# Patient Record
Sex: Male | Born: 1970 | Hispanic: No | Marital: Married | Smoking: Never smoker
Health system: Southern US, Community
[De-identification: ages and names within clinical notes are randomized; demographics above are authoritative.]

## PROBLEM LIST (undated history)

## (undated) DIAGNOSIS — E785 Hyperlipidemia, unspecified: Secondary | ICD-10-CM

## (undated) DIAGNOSIS — I10 Essential (primary) hypertension: Secondary | ICD-10-CM

## (undated) DIAGNOSIS — I252 Old myocardial infarction: Secondary | ICD-10-CM

## (undated) DIAGNOSIS — I2510 Atherosclerotic heart disease of native coronary artery without angina pectoris: Secondary | ICD-10-CM

## (undated) DIAGNOSIS — I5032 Chronic diastolic (congestive) heart failure: Secondary | ICD-10-CM

## (undated) DIAGNOSIS — N200 Calculus of kidney: Secondary | ICD-10-CM

## (undated) DIAGNOSIS — K219 Gastro-esophageal reflux disease without esophagitis: Secondary | ICD-10-CM

## (undated) DIAGNOSIS — I255 Ischemic cardiomyopathy: Secondary | ICD-10-CM

## (undated) DIAGNOSIS — D72819 Decreased white blood cell count, unspecified: Secondary | ICD-10-CM

## (undated) HISTORY — DX: Old myocardial infarction: I25.2

## (undated) HISTORY — DX: Atherosclerotic heart disease of native coronary artery without angina pectoris: I25.10

## (undated) HISTORY — DX: Hyperlipidemia, unspecified: E78.5

## (undated) HISTORY — DX: Essential (primary) hypertension: I10

## (undated) HISTORY — DX: Decreased white blood cell count, unspecified: D72.819

## (undated) HISTORY — DX: Gastro-esophageal reflux disease without esophagitis: K21.9

## (undated) HISTORY — DX: Chronic diastolic (congestive) heart failure: I50.32

---
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
## 2003-02-25 ENCOUNTER — Emergency Department (HOSPITAL_COMMUNITY): Admission: EM | Admit: 2003-02-25 | Discharge: 2003-02-25 | Payer: Self-pay | Admitting: Emergency Medicine

## 2007-06-15 ENCOUNTER — Ambulatory Visit: Payer: Self-pay | Admitting: Internal Medicine

## 2007-06-15 ENCOUNTER — Encounter (INDEPENDENT_AMBULATORY_CARE_PROVIDER_SITE_OTHER): Payer: Self-pay | Admitting: Nurse Practitioner

## 2007-06-15 LAB — CONVERTED CEMR LAB
ALT: 15 units/L (ref 0–53)
AST: 16 units/L (ref 0–37)
Albumin: 4.6 g/dL (ref 3.5–5.2)
Alkaline Phosphatase: 73 units/L (ref 39–117)
BUN: 9 mg/dL (ref 6–23)
Basophils Absolute: 0 10*3/uL (ref 0.0–0.1)
Basophils Relative: 1 % (ref 0–1)
CO2: 26 meq/L (ref 19–32)
Calcium: 9.6 mg/dL (ref 8.4–10.5)
Chloride: 105 meq/L (ref 96–112)
Creatinine, Ser: 0.86 mg/dL (ref 0.40–1.50)
Eosinophils Absolute: 0.1 10*3/uL (ref 0.0–0.7)
Eosinophils Relative: 2 % (ref 0–5)
Glucose, Bld: 98 mg/dL (ref 70–99)
HCT: 48.5 % (ref 39.0–52.0)
Helicobacter Pylori Antibody-IgG: >8 — ABNORMAL HIGH
Hemoglobin: 16.4 g/dL (ref 13.0–17.0)
Lymphocytes Relative: 40 % (ref 12–46)
Lymphs Abs: 1.2 10*3/uL (ref 0.7–4.0)
MCHC: 33.8 g/dL (ref 30.0–36.0)
MCV: 87.2 fL (ref 78.0–100.0)
Monocytes Absolute: 0.3 10*3/uL (ref 0.1–1.0)
Monocytes Relative: 9 % (ref 3–12)
Neutro Abs: 1.4 10*3/uL — ABNORMAL LOW (ref 1.7–7.7)
Neutrophils Relative %: 47 % (ref 43–77)
Platelets: 263 10*3/uL (ref 150–400)
Potassium: 4.1 meq/L (ref 3.5–5.3)
RBC: 5.56 M/uL (ref 4.22–5.81)
RDW: 12.9 % (ref 11.5–15.5)
Sodium: 141 meq/L (ref 135–145)
TSH: 1.728 microintl units/mL (ref 0.350–5.50)
Total Bilirubin: 0.6 mg/dL (ref 0.3–1.2)
Total Protein: 7.4 g/dL (ref 6.0–8.3)
WBC: 3 10*3/uL — ABNORMAL LOW (ref 4.0–10.5)

## 2007-06-16 ENCOUNTER — Ambulatory Visit: Payer: Self-pay | Admitting: *Deleted

## 2007-06-21 ENCOUNTER — Encounter (INDEPENDENT_AMBULATORY_CARE_PROVIDER_SITE_OTHER): Payer: Self-pay | Admitting: Nurse Practitioner

## 2007-06-21 ENCOUNTER — Ambulatory Visit: Payer: Self-pay | Admitting: Family Medicine

## 2007-06-21 LAB — CONVERTED CEMR LAB
Cholesterol: 175 mg/dL (ref 0–200)
HDL: 38 mg/dL — ABNORMAL LOW (ref >39)
LDL Cholesterol: 111 mg/dL — ABNORMAL HIGH (ref 0–99)
Total CHOL/HDL Ratio: 4.6
Triglycerides: 132 mg/dL (ref <150)
VLDL: 26 mg/dL (ref 0–40)

## 2007-07-14 ENCOUNTER — Ambulatory Visit: Payer: Self-pay | Admitting: Internal Medicine

## 2012-05-02 ENCOUNTER — Emergency Department (INDEPENDENT_AMBULATORY_CARE_PROVIDER_SITE_OTHER): Payer: BC Managed Care – PPO

## 2012-05-02 ENCOUNTER — Encounter (HOSPITAL_COMMUNITY): Payer: Self-pay | Admitting: Emergency Medicine

## 2012-05-02 ENCOUNTER — Emergency Department (HOSPITAL_COMMUNITY)
Admission: EM | Admit: 2012-05-02 | Discharge: 2012-05-02 | Disposition: A | Discharge status: Home / Self Care | Payer: BC Managed Care – PPO | Source: Home / Self Care | Attending: Emergency Medicine | Admitting: Emergency Medicine

## 2012-05-02 DIAGNOSIS — R0789 Other chest pain: Secondary | ICD-10-CM

## 2012-05-02 DIAGNOSIS — R071 Chest pain on breathing: Secondary | ICD-10-CM

## 2012-05-02 MED ORDER — MELOXICAM 7.5 MG PO TABS: 7.5000 mg | ORAL_TABLET | Freq: Every day | ORAL | Status: DC

## 2012-05-02 NOTE — ED Notes (Signed)
Pt c/o pain on left side of chest onset 1 month Pain is intermittent; 2 weeks ago, left side of chest swollen and painful w/pressure Denies: SOB, blurry vision, headache, edema  He is alert and oriented w/no signs of acute distress.

## 2012-05-02 NOTE — ED Provider Notes (Signed)
History     CSN: [id number]  Arrival date & time 05/02/12  [place]   First MD Initiated Contact with Patient 05/02/12 1854      Chief Complaint  Patient presents with  . Chest Pain    (Consider location/radiation/quality/duration/timing/severity/associated sxs/prior treatment) HPI Comments: Patient presents this evening to urgent care describing for about a month he's been having left anterior chest pain. Patient points with one finger anteriorly to his chest some on his fifth intercostal space. 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 Denies any associated symptoms such as cough, shortness of breath, sweating, radiation of this pain. Describes a certain movements or activities that makes the pain worse. Describes the pain as constant.  Patient is a 42 y.o. male presenting with chest pain. The history is provided by the patient.  Chest Pain Pain location:  L chest Pain quality: aching   Pain radiates to:  Does not radiate Pain radiates to the back: no   Pain severity:  Moderate Onset quality:  Gradual Duration:  4 weeks Progression:  Partially resolved Chronicity:  New Context: lifting and movement   Context: not breathing, not raising an arm, not at rest and no trauma   Relieved by:  Rest Worsened by:  Nothing tried Associated symptoms: no abdominal pain, no back pain, no cough, no dizziness, no fever, no numbness, no palpitations and no shortness of breath   Risk factors: no aortic disease, no coronary artery disease, no hypertension, no prior DVT/PE, no smoking and no surgery     History reviewed. No pertinent past medical history.  History reviewed. No pertinent past surgical history.  No family history on file.  History  Substance Use Topics  . Smoking status: Never Smoker   . Smokeless tobacco: Not on file  . Alcohol Use: No      Review of Systems  Constitutional:  Negative for fever, activity change and appetite change.  Respiratory: Negative for apnea, cough, chest tightness, shortness of breath, wheezing and stridor.   Cardiovascular: Positive for chest pain. Negative for palpitations and leg swelling.  Gastrointestinal: Negative for abdominal pain.  Musculoskeletal: Positive for joint swelling. Negative for back pain and gait problem.  Skin: Negative for color change, rash and wound.  Neurological: Negative for dizziness and numbness.    Allergies  Review of patient's allergies indicates no known allergies.  Home Medications   Current Outpatient Rx  Name  Route  Sig  Dispense  Refill  . meloxicam (MOBIC) 7.5 MG tablet   Oral   Take 1 tablet (7.5 mg total) by mouth daily. Take one tablet daily for 2 weeks   14 tablet   0     BP 152/93  Pulse 62  Temp(Src) 98.5 F (36.9 C) (Oral)  Resp 20  SpO2 99%  Physical Exam  Nursing note and vitals reviewed. Constitutional: Vital signs are normal. He appears well-developed and well-nourished.  Non-toxic appearance. He does not have a sickly appearance. He does not appear ill. No distress.  HENT:  Head: Normocephalic.  Eyes: Right eye exhibits no discharge. Left eye exhibits no discharge. No scleral icterus.  Neck: Neck supple. No JVD present.  Cardiovascular: Normal rate and regular rhythm.  Exam reveals no gallop and no friction rub.   No murmur heard. Pulmonary/Chest: Effort normal and breath sounds normal. No respiratory distress. He has no decreased breath sounds. He has no  wheezes. He has no rhonchi. He has no rales. He exhibits no tenderness.    Musculoskeletal: He exhibits tenderness.  Neurological: He is alert.  Skin: No rash noted. No erythema.    ED Course  Procedures (including critical care time)  Labs Reviewed - No data to display Dg Chest 2 View  05/02/2012  *RADIOLOGY REPORT*  Clinical Data: Chest pain.  CHEST - 2 VIEW  Comparison: None  Findings: The cardiac  silhouette, mediastinal and hilar contours are normal.  The lungs are clear.  Slight hyperinflation.  No pleural effusion.  The bony thorax is intact.  IMPRESSION: Mild hyperinflation but no acute pulmonary findings.   Original Report Authenticated By: Rudie Meyer, M.D.      1. Chest wall discomfort     EKG with a ventricular rate of 56 beats per minute normal PR and QRS duration. No QT prolongation no ST or T-wave abnormality suggestive of acute or remote ischemia. Mild bradycardia to 56 beats per minute on apical exam pulse goes up to 62 beats per minute.  MDM  Exam and current symptoms more consistent with muscle skeletal chest wall pain. Will probably try for a course of meloxicam for 10-14 days. Patient has been encouraged to followup with her primary care doctor to recheck his blood pressure in the near future. Have also been advised about symptoms that should warrant further evaluation in the emergency department.        Jimmie Molly, MD 05/02/12 2017

## 2012-08-31 ENCOUNTER — Emergency Department (HOSPITAL_COMMUNITY)
Admission: EM | Admit: 2012-08-31 | Discharge: 2012-08-31 | Disposition: A | Discharge status: Home / Self Care | Payer: Medicaid Other | Attending: Emergency Medicine | Admitting: Emergency Medicine

## 2012-08-31 ENCOUNTER — Encounter (HOSPITAL_COMMUNITY): Payer: Self-pay | Admitting: Emergency Medicine

## 2012-08-31 ENCOUNTER — Emergency Department (HOSPITAL_COMMUNITY): Payer: Medicaid Other

## 2012-08-31 DIAGNOSIS — R109 Unspecified abdominal pain: Secondary | ICD-10-CM

## 2012-08-31 LAB — URINALYSIS, ROUTINE W REFLEX MICROSCOPIC
Bilirubin Urine: NEGATIVE
Glucose, UA: NEGATIVE mg/dL
Hgb urine dipstick: NEGATIVE
Ketones, ur: NEGATIVE mg/dL
Leukocytes, UA: NEGATIVE
Nitrite: NEGATIVE
Protein, ur: NEGATIVE mg/dL
Specific Gravity, Urine: 1.021 (ref 1.005–1.030)
Urobilinogen, UA: 0.2 mg/dL (ref 0.0–1.0)
pH: 6 (ref 5.0–8.0)

## 2012-08-31 MED ORDER — DICYCLOMINE HCL 10 MG PO CAPS: 10.0000 mg | ORAL_CAPSULE | Freq: Four times a day (QID) | ORAL | Status: DC | PRN

## 2012-08-31 NOTE — ED Notes (Signed)
Per Pt, Pt has been having pain in his right side x3 days. Pt reports pain has progressively gotten worse and reports that pain is an 8/10 ache. Pain gets worse on inspiration palpation does not reproduce pain. Pt denies: SOB, N/V, Hematuria, Frequency, and burning on urination. Pt is a/o x4, NAD

## 2012-08-31 NOTE — ED Provider Notes (Signed)
Jeremy Vazquez is a 42 y.o. male with crampy abdominal pain, early satiety, eating only one meal a day, and frequently passing flatus; all ongoing for several years. He is being evaluated infirmary, at school and treated with unknown medication. He denies fever, chills, bloody stools, constipation, diarrhea, hematuria, back, pain, weakness, or dizziness.  Exam: Alert, calm, cooperative ambulates easily. Abdomen normal bowel sounds, soft, nontender to palpation. No masses no hepatosplenomegaly.  Assessment: Nonspecific gastrointestinal disturbance unlikely to indicate an acute colitis, appendicitis, hepatic, pancreatic or renal disturbance  Plan- symptomatic treatment with Bentyl. Referral for PCP, evaluation and possibly GI, after that.  Medical screening examination/treatment/procedure(s) were conducted as a shared visit with non-physician practitioner(s) and myself.  I personally evaluated the patient during the encounter  Flint Melter, MD 08/31/12 1538

## 2012-08-31 NOTE — ED Provider Notes (Signed)
CSN: [id number]     Arrival date & time 08/31/12  4098 History     First MD Initiated Contact with Patient 08/31/12 [phone]     Chief Complaint  Patient presents with  . Flank Pain   (Consider location/radiation/quality/duration/timing/severity/associated sxs/prior Treatment) HPI Comments: Patient is a 42 year old male presenting to the emergency room for 3 days of intermittent sharp cramping nonradiating right-sided abdominal pain. Patient endorses that he has had this pain on and off for several years but the last 2 days and the most recent flareup. Patient reports his pain as 8/10 currently states it is worse at night and with deep inspiration. He denies any alleviating or other aggravating factors. Pt does endorse increased flatus and states he only eats one meal a day. Patient states that he an incidence of a kidney stone over ten years ago, but his pain and symptoms were different from today. Last BM was yesterday. Denies any history of abdominal surgeries. Denies shortness of breath, chest pain, nausea, vomiting, urinary symptoms, diarrhea, or melena. Patient does not have a primary care doctor.   History reviewed. No pertinent past medical history. History reviewed. No pertinent past surgical history. History reviewed. No pertinent family history. History  Substance Use Topics  . Smoking status: Never Smoker   . Smokeless tobacco: Not on file  . Alcohol Use: No    Review of Systems  Constitutional: Negative for fever and chills.  HENT: Negative.   Eyes: Negative.   Respiratory: Negative for shortness of breath.   Cardiovascular: Negative for chest pain and leg swelling.  Gastrointestinal: Positive for abdominal pain. Negative for nausea, vomiting, diarrhea, constipation, blood in stool, abdominal distention, anal bleeding and rectal pain.  Genitourinary: Negative.   Musculoskeletal: Negative.   Skin: Negative.   Neurological: Negative.     Allergies  Review of patient's  allergies indicates no known allergies.  Home Medications   Current Outpatient Rx  Name  Route  Sig  Dispense  Refill  . Phenyleph-Doxylamine-DM-APAP (ALKA-SELTZER PLS ALLERGY [facility] PO)   Oral   Take 2 tablets by mouth once.         . dicyclomine (BENTYL) 10 MG capsule   Oral   Take 1 capsule (10 mg total) by mouth 4 (four) times daily as needed.   30 capsule   0    BP 151/94  Pulse 56  Temp(Src) 97.4 F (36.3 C) (Oral)  Resp 20  SpO2 100% Physical Exam  Constitutional: He is oriented to person, place, and time. He appears well-developed and well-nourished. No distress.  HENT:  Head: Normocephalic and atraumatic.  Right Ear: External ear normal.  Left Ear: External ear normal.  Nose: Nose normal.  Mouth/Throat: Oropharynx is clear and moist.  Eyes: Conjunctivae are normal.  Neck: Normal range of motion. Neck supple.  Cardiovascular: Normal rate, regular rhythm, normal heart sounds and intact distal pulses.   Pulmonary/Chest: Effort normal and breath sounds normal.  Abdominal: Soft. Bowel sounds are normal. He exhibits no distension. There is no tenderness. There is no rigidity, no rebound, no guarding, no CVA tenderness, no tenderness at McBurney's point and negative Murphy's sign.    Musculoskeletal: Normal range of motion. He exhibits no edema.  Neurological: He is alert and oriented to person, place, and time.  Skin: Skin is warm and dry. He is not diaphoretic.    ED Course   Procedures (including critical care time)  Labs Reviewed  URINALYSIS, ROUTINE W REFLEX MICROSCOPIC   Dg Chest  2 View  08/31/2012   *RADIOLOGY REPORT*  Clinical Data: Pain  CHEST - 2 VIEW  Comparison: May 02, 2012  Findings: Lungs clear.  Heart size and pulmonary vascularity are normal.  No adenopathy.  No bone lesions.  IMPRESSION: No abnormality noted.   Original Report Authenticated By: Bretta Bang, M.D.   1. Abdominal pain     MDM  Patient presenting with right-sided  cramping abdominal pain. Patient alert, active, oriented x4. Abdomen is nonacute. Abdomen soft, nontender, nondistended with with good bowel sounds, no guarding, no rigidity, no rebound. I have personally reviewed results and vitals.  Pain unlikely an acute colitis, appendicitis, hepatic, pancreatic or renal disturbance. Patient will be given Bentyl prescription and referral to Dr[facility] with possible GI followup from there. Return precautions have been discussed and patient is agreeable to plan. Discussed this case with Dr. wants to increase with my assessment and plan. Patient stable at time of discharge.  Jeannetta Ellis, PA-C 08/31/12 1207

## 2012-11-10 ENCOUNTER — Ambulatory Visit: Payer: Medicaid Other | Admitting: Internal Medicine

## 2012-11-15 ENCOUNTER — Ambulatory Visit: Payer: Medicaid Other | Attending: Internal Medicine | Admitting: Internal Medicine

## 2012-11-15 VITALS — BP 156/100 | HR 76 | Temp 98.2°F | Resp 17 | Wt 156.0 lb

## 2012-11-15 DIAGNOSIS — R141 Gas pain: Secondary | ICD-10-CM | POA: Insufficient documentation

## 2012-11-15 DIAGNOSIS — I10 Essential (primary) hypertension: Secondary | ICD-10-CM

## 2012-11-15 DIAGNOSIS — R142 Eructation: Secondary | ICD-10-CM | POA: Insufficient documentation

## 2012-11-15 LAB — LIPID PANEL
Cholesterol: 185 mg/dL (ref 0–200)
HDL: 39 mg/dL — ABNORMAL LOW (ref >39)
LDL Cholesterol: 114 mg/dL — ABNORMAL HIGH (ref 0–99)
Total CHOL/HDL Ratio: 4.7 Ratio
Triglycerides: 159 mg/dL — ABNORMAL HIGH (ref <150)
VLDL: 32 mg/dL (ref 0–40)

## 2012-11-15 LAB — CBC WITH DIFFERENTIAL/PLATELET
Basophils Absolute: 0 10*3/uL (ref 0.0–0.1)
Basophils Relative: 0 % (ref 0–1)
Eosinophils Absolute: 0.3 10*3/uL (ref 0.0–0.7)
Eosinophils Relative: 7 % — ABNORMAL HIGH (ref 0–5)
HCT: 48 % (ref 39.0–52.0)
Hemoglobin: 16.8 g/dL (ref 13.0–17.0)
Lymphocytes Relative: 45 % (ref 12–46)
Lymphs Abs: 1.6 10*3/uL (ref 0.7–4.0)
MCH: 29.5 pg (ref 26.0–34.0)
MCHC: 35 g/dL (ref 30.0–36.0)
MCV: 84.2 fL (ref 78.0–100.0)
Monocytes Absolute: 0.3 10*3/uL (ref 0.1–1.0)
Monocytes Relative: 9 % (ref 3–12)
Neutro Abs: 1.4 10*3/uL — ABNORMAL LOW (ref 1.7–7.7)
Neutrophils Relative %: 39 % — ABNORMAL LOW (ref 43–77)
Platelets: 262 10*3/uL (ref 150–400)
RBC: 5.7 MIL/uL (ref 4.22–5.81)
RDW: 13.9 % (ref 11.5–15.5)
WBC: 3.6 10*3/uL — ABNORMAL LOW (ref 4.0–10.5)

## 2012-11-15 LAB — COMPREHENSIVE METABOLIC PANEL
ALT: 24 U/L (ref 0–53)
AST: 19 U/L (ref 0–37)
Albumin: 4.7 g/dL (ref 3.5–5.2)
Alkaline Phosphatase: 61 U/L (ref 39–117)
BUN: 18 mg/dL (ref 6–23)
CO2: 33 mEq/L — ABNORMAL HIGH (ref 19–32)
Calcium: 9.9 mg/dL (ref 8.4–10.5)
Chloride: 101 mEq/L (ref 96–112)
Creat: 0.81 mg/dL (ref 0.50–1.35)
Glucose, Bld: 94 mg/dL (ref 70–99)
Potassium: 4.7 mEq/L (ref 3.5–5.3)
Sodium: 139 mEq/L (ref 135–145)
Total Bilirubin: 0.5 mg/dL (ref 0.3–1.2)
Total Protein: 7.6 g/dL (ref 6.0–8.3)

## 2012-11-15 LAB — TSH: TSH: 1.893 u[IU]/mL (ref 0.350–4.500)

## 2012-11-15 NOTE — Progress Notes (Signed)
Patient here to establish care Complains of having stomach pain and passes a lot of Gas OTC medications he has used has not helped Needs referral to GI

## 2012-11-15 NOTE — Progress Notes (Signed)
Patient ID: Jeremy Vazquez, male   DOB: 1970/04/30, 42 y.o.   MRN: [id number]   CC: Abdominal distention  HPI: Patient is 42 year old male with no past medical history who presents to clinic with main concern of 2-3 years duration of abdominal distention. Patient denies any specific abdominal pain, no nausea or vomiting, no weight loss or weight gain, no fevers and chills, no urinary concerns. He says the problem is ongoing and long-standing. He has not had any evaluation to this point.  No Known Allergies History reviewed. No pertinent past medical history. Current Outpatient Prescriptions on File Prior to Visit  Medication Sig Dispense Refill  . dicyclomine (BENTYL) 10 MG capsule Take 1 capsule (10 mg total) by mouth 4 (four) times daily as needed.  30 capsule  0  . Phenyleph-Doxylamine-DM-APAP (ALKA-SELTZER PLS ALLERGY [facility] PO) Take 2 tablets by mouth once.       No current facility-administered medications on file prior to visit.   No past family medical history History   Social History  . Marital Status: Single    Spouse Name: N/A    Number of Children: N/A  . Years of Education: N/A   Occupational History  . Not on file.   Social History Main Topics  . Smoking status: Never Smoker   . Smokeless tobacco: Not on file  . Alcohol Use: No  . Drug Use: No  . Sexual Activity: Not on file   Other Topics Concern  . Not on file   Social History Narrative  . No narrative on file    Review of Systems  Constitutional: Negative for fever, chills, diaphoresis, activity change, appetite change and fatigue.  HENT: Negative for ear pain, nosebleeds, congestion, facial swelling, rhinorrhea, neck pain, neck stiffness and ear discharge.   Eyes: Negative for pain, discharge, redness, itching and visual disturbance.  Respiratory: Negative for cough, choking, chest tightness, shortness of breath, wheezing and stridor.   Cardiovascular: Negative for chest pain, palpitations and leg  swelling.  Gastrointestinal: Per history of present illness  Genitourinary: Negative for dysuria, urgency, frequency, hematuria, flank pain, decreased urine volume, difficulty urinating and dyspareunia.  Musculoskeletal: Negative for back pain, joint swelling, arthralgias and gait problem.  Neurological: Negative for dizziness, tremors, seizures, syncope, facial asymmetry, speech difficulty, weakness, light-headedness, numbness and headaches.  Hematological: Negative for adenopathy. Does not bruise/bleed easily.  Psychiatric/Behavioral: Negative for hallucinations, behavioral problems, confusion, dysphoric mood, decreased concentration and agitation.    Objective:   Filed Vitals:   11/15/12 0958  BP: 156/100  Pulse: 76  Temp: 98.2 F (36.8 C)  Resp: 17    Physical Exam  Constitutional: Appears well-developed and well-nourished. No distress.  HENT: Normocephalic. External right and left ear normal. Oropharynx is clear and moist.  Eyes: Conjunctivae and EOM are normal. PERRLA, no scleral icterus.  Neck: Normal ROM. Neck supple. No JVD. No tracheal deviation. No thyromegaly.  CVS: RRR, S1/S2 +, no murmurs, no gallops, no carotid bruit.  Pulmonary: Effort and breath sounds normal, no stridor, rhonchi, wheezes, rales.  Abdominal: Soft. BS +,  mild distension, no tenderness, rebound or guarding.  Musculoskeletal: Normal range of motion. No edema and no tenderness.  Lymphadenopathy: No lymphadenopathy noted, cervical, inguinal. Neuro: Alert. Normal reflexes, muscle tone coordination. No cranial nerve deficit. Skin: Skin is warm and dry. No rash noted. Not diaphoretic. No erythema. No pallor.  Psychiatric: Normal mood and affect. Behavior, judgment, thought content normal.   Lab Results  Component Value Date  WBC 3.0* 06/15/2007   HGB 16.4 06/15/2007   HCT 48.5 06/15/2007   MCV 87.2 06/15/2007   PLT 263 06/15/2007   Lab Results  Component Value Date   CREATININE 0.86 06/15/2007   BUN  9 06/15/2007   NA 141 06/15/2007   K 4.1 06/15/2007   CL 105 06/15/2007   CO2 26 06/15/2007    No results found for this basename: HGBA1C   Lipid Panel     Component Value Date/Time   CHOL 175 06/21/2007 2021   TRIG 132 06/21/2007 2021   HDL 38* 06/21/2007 2021   CHOLHDL 4.6 Ratio 06/21/2007 2021   VLDL 26 06/21/2007 2021   LDLCALC 111* 06/21/2007 2021       Assessment and plan:   Abdominal distension - unclear etiology, dietary restrictions provided, plan on ordering abdominal ultrasound for further evaluation. We'll check electrolyte panel today and TSH.

## 2012-11-15 NOTE — Patient Instructions (Signed)

## 2012-11-18 ENCOUNTER — Ambulatory Visit (HOSPITAL_COMMUNITY)
Admission: RE | Admit: 2012-11-18 | Discharge: 2012-11-18 | Disposition: A | Discharge status: Home / Self Care | Payer: Medicaid Other | Source: Ambulatory Visit | Attending: Internal Medicine | Admitting: Internal Medicine

## 2012-11-18 DIAGNOSIS — R142 Eructation: Secondary | ICD-10-CM | POA: Insufficient documentation

## 2012-11-18 DIAGNOSIS — R141 Gas pain: Secondary | ICD-10-CM | POA: Insufficient documentation

## 2012-11-18 DIAGNOSIS — I10 Essential (primary) hypertension: Secondary | ICD-10-CM

## 2012-11-18 DIAGNOSIS — Q619 Cystic kidney disease, unspecified: Secondary | ICD-10-CM | POA: Insufficient documentation

## 2012-12-27 ENCOUNTER — Ambulatory Visit: Payer: Medicaid Other | Attending: Internal Medicine

## 2012-12-27 ENCOUNTER — Other Ambulatory Visit: Payer: Self-pay | Admitting: Internal Medicine

## 2012-12-27 MED ORDER — DICYCLOMINE HCL 10 MG PO CAPS: 10.0000 mg | ORAL_CAPSULE | Freq: Three times a day (TID) | ORAL | Status: DC

## 2012-12-30 ENCOUNTER — Ambulatory Visit: Payer: Medicaid Other

## 2013-01-03 ENCOUNTER — Ambulatory Visit: Payer: Medicaid Other | Attending: Internal Medicine | Admitting: Internal Medicine

## 2013-01-03 ENCOUNTER — Encounter: Payer: Self-pay | Admitting: Internal Medicine

## 2013-01-03 VITALS — BP 168/106 | HR 71 | Temp 98.9°F | Resp 16 | Ht 71.0 in | Wt 157.0 lb

## 2013-01-03 DIAGNOSIS — D72819 Decreased white blood cell count, unspecified: Secondary | ICD-10-CM

## 2013-01-03 DIAGNOSIS — I10 Essential (primary) hypertension: Secondary | ICD-10-CM | POA: Insufficient documentation

## 2013-01-03 DIAGNOSIS — R198 Other specified symptoms and signs involving the digestive system and abdomen: Secondary | ICD-10-CM

## 2013-01-03 HISTORY — DX: Decreased white blood cell count, unspecified: D72.819

## 2013-01-03 HISTORY — DX: Essential (primary) hypertension: I10

## 2013-01-03 LAB — HEPATITIS B SURFACE ANTIGEN: Hepatitis B Surface Ag: NEGATIVE

## 2013-01-03 LAB — HIV ANTIBODY (ROUTINE TESTING W REFLEX): HIV: NONREACTIVE

## 2013-01-03 LAB — HEPATITIS C ANTIBODY: HCV Ab: NEGATIVE

## 2013-01-03 MED ORDER — AMLODIPINE BESYLATE 5 MG PO TABS: 5.0000 mg | ORAL_TABLET | Freq: Every day | ORAL | Status: DC

## 2013-01-03 NOTE — Progress Notes (Signed)
Patient ID: TEMESGEN WEIGHTMAN, male   DOB: 06-04-1970, 42 y.o.   MRN: [id number] Subjective: 42 year old [place] male who is here for followup of his recent labs and ultrasound over his abdomen. Patient reports abdominal fullness for several years worsened following meals with gaseous distention. Denies loss of weight or bowel symptoms.. He denies any fever, chills, headache, blurred vision, chest pain, shortness of breath, dizziness, nausea, vomiting, bowel or urinary symptoms. He denies any joint pains. He denies recent travel.  Review of systems Outlined in history of present illness  Vital signs in last 24 hours:  Filed Vitals:   01/03/13 1131  BP: 168/106  Pulse: 71  Temp: 98.9 F (37.2 C)  TempSrc: Oral  Resp: 16  Height: 5\' 11"  (1.803 m)  Weight: 157 lb (71.215 kg)  SpO2: 98%     Physical Exam:  General: Middle-aged male in no acute distress. HEENT: no pallor, no icterus, moist oral mucosa no lymphadenopathy Heart: Normal  s1 &s2  Regular rate and rhythm, without murmurs, rubs, gallops. Lungs: Clear to auscultation bilaterally. Abdomen: Soft, nontender, nondistended, positive bowel sounds. Extremities: Warm, no edema Neuro: Alert, awake, oriented x3, nonfocal.   Lab Results:  Basic Metabolic Panel:    Component Value Date/Time   NA 139 11/15/2012 1023   K 4.7 11/15/2012 1023   CL 101 11/15/2012 1023   CO2 33* 11/15/2012 1023   BUN 18 11/15/2012 1023   CREATININE 0.81 11/15/2012 1023   CREATININE 0.86 06/15/2007 2218   GLUCOSE 94 11/15/2012 1023   CALCIUM 9.9 11/15/2012 1023   CBC:    Component Value Date/Time   WBC 3.6* 11/15/2012 1023   HGB 16.8 11/15/2012 1023   HCT 48.0 11/15/2012 1023   PLT 262 11/15/2012 1023   MCV 84.2 11/15/2012 1023   NEUTROABS 1.4* 11/15/2012 1023   LYMPHSABS 1.6 11/15/2012 1023   MONOABS 0.3 11/15/2012 1023   EOSABS 0.3 11/15/2012 1023   BASOSABS 0.0 11/15/2012 1023    No results found for this or any previous visit (from  the past 240 hour(s)).  Studies/Results: No results found.  Medications: Scheduled Meds: Continuous Infusions: PRN Meds:.    Assessment/Plan:  Abdominal fullness No clear etiology. ? IBS. Recently given a course of dicyclomine which minimally relieves symptoms. Counseled on small frequent meals and high fiber diet including increased intake of fruits and vegetables. He reports having high meat intake and i have instructed him to cut down on it. Denies alcohol use or smoking. 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  Hypertension Patient noted to have markedly elevated blood pressure on 2 occasions recently. I will start him on low-dose amlodipine. I have counseled him on salt restricted diet and regular exercise. We will follow him up in the clinic in one week for blood pressure monitoring.  Leukopenia Unexplained. A1c greater than 1500. We'll check HIV, if these antigen and HCV antibody.  Followup in one week for blood pressure monitoring and in 3 months thereafter     Emmerson Shuffield 01/03/2013, 12:09 PM

## 2013-01-03 NOTE — Progress Notes (Signed)
Pt is here following up on his HTN Pt reports having polyuria. Pt states that he is having a lot of gas.

## 2013-01-27 ENCOUNTER — Ambulatory Visit: Payer: Medicaid Other | Attending: Internal Medicine

## 2013-01-27 ENCOUNTER — Encounter: Payer: Self-pay | Admitting: Internal Medicine

## 2013-01-27 ENCOUNTER — Ambulatory Visit (HOSPITAL_BASED_OUTPATIENT_CLINIC_OR_DEPARTMENT_OTHER): Payer: Medicaid Other | Admitting: Internal Medicine

## 2013-01-27 VITALS — BP 136/80 | HR 67 | Temp 97.9°F | Resp 17 | Wt 156.6 lb

## 2013-01-27 DIAGNOSIS — I10 Essential (primary) hypertension: Secondary | ICD-10-CM

## 2013-01-27 DIAGNOSIS — E785 Hyperlipidemia, unspecified: Secondary | ICD-10-CM

## 2013-01-27 DIAGNOSIS — Z23 Encounter for immunization: Secondary | ICD-10-CM

## 2013-01-27 NOTE — Progress Notes (Signed)
MRN: [id number] Name: Jeremy Vazquez  Sex: male Age: 43 y.o. DOB: 1970-11-30  Allergies: Review of patient's allergies indicates no known allergies.  Chief Complaint  Patient presents with  . Hypertension    HPI: Patient is 43 y.o. male who has to of hypertension, last month he was started on Norvasc 5 mg daily as per patient is compliant with his medications, as per patient he recently checked his blood pressure at the pharmacy and reported that it was 137/99, patient denies any acute symptoms denies any headache dizziness chest and shortness of breath. Today's blood pressure is 136/80.  History reviewed. No pertinent past medical history.  History reviewed. No pertinent past surgical history.    Medication List       This list is accurate as of: 01/27/13  4:23 PM.  Always use your most recent med list.               ALKA-SELTZER PLS ALLERGY [facility] PO  Take 2 tablets by mouth once.     amLODipine 5 MG tablet  Commonly known as:  NORVASC  Take 1 tablet (5 mg total) by mouth daily.     dicyclomine 10 MG capsule  Commonly known as:  BENTYL  Take 1 capsule (10 mg total) by mouth 4 (four) times daily -  before meals and at bedtime.        No orders of the defined types were placed in this encounter.     There is no immunization history on file for this patient.  History reviewed. No pertinent family history.  History  Substance Use Topics  . Smoking status: Never Smoker   . Smokeless tobacco: Not on file  . Alcohol Use: No    Review of Systems  As noted in HPI  Filed Vitals:   01/27/13 1603  BP: 136/80  Pulse: 67  Temp: 97.9 F (36.6 C)  Resp: 17    Physical Exam  Physical Exam  Constitutional: No distress.  Eyes: EOM are normal. Pupils are equal, round, and reactive to light.  Cardiovascular: Normal rate and regular rhythm.   Pulmonary/Chest: Breath sounds normal. No respiratory distress. He has no wheezes.  Musculoskeletal: He exhibits no  edema.    CBC    Component Value Date/Time   WBC 3.6* 11/15/2012 1023   RBC 5.70 11/15/2012 1023   HGB 16.8 11/15/2012 1023   HCT 48.0 11/15/2012 1023   PLT 262 11/15/2012 1023   MCV 84.2 11/15/2012 1023   LYMPHSABS 1.6 11/15/2012 1023   MONOABS 0.3 11/15/2012 1023   EOSABS 0.3 11/15/2012 1023   BASOSABS 0.0 11/15/2012 1023    CMP     Component Value Date/Time   NA 139 11/15/2012 1023   K 4.7 11/15/2012 1023   CL 101 11/15/2012 1023   CO2 33* 11/15/2012 1023   GLUCOSE 94 11/15/2012 1023   BUN 18 11/15/2012 1023   CREATININE 0.81 11/15/2012 1023   CREATININE 0.86 06/15/2007 2218   CALCIUM 9.9 11/15/2012 1023   PROT 7.6 11/15/2012 1023   ALBUMIN 4.7 11/15/2012 1023   AST 19 11/15/2012 1023   ALT 24 11/15/2012 1023   ALKPHOS 61 11/15/2012 1023   BILITOT 0.5 11/15/2012 1023    Lab Results  Component Value Date/Time   CHOL 185 11/15/2012 10:23 AM    No components found with this basename: hga1c    Lab Results  Component Value Date/Time   AST 19 11/15/2012 10:23 AM    Assessment and  Plan  Essential hypertension, benign Blood pressure is improved continue with Norvasc 5 mg also advise patient for low salt diet.  Other and unspecified hyperlipidemia Advise for low fat diet.  Needs flu shot  Flu shot Given today  Return in about 3 months (around 04/27/2013).  Lorayne Marek, MD

## 2013-01-27 NOTE — Progress Notes (Signed)
Patient states checks his blood pressure at [facility] Two days ago it was  137/99 Walk in- because he was concerned about the pressure

## 2013-01-27 NOTE — Patient Instructions (Signed)
2 Gram Low Sodium Diet A 2 gram sodium diet restricts the amount of sodium in the diet to no more than 2 g or 2000 mg daily. Limiting the amount of sodium is often used to help lower blood pressure. It is important if you have heart, liver, or kidney problems. Many foods contain sodium for flavor and sometimes as a preservative. When the amount of sodium in a diet needs to be low, it is important to know what to look for when choosing foods and drinks. The following includes some information and guidelines to help make it easier for you to adapt to a low sodium diet. QUICK TIPS  Do not add salt to food.  Avoid convenience items and fast food.  Choose unsalted snack foods.  Buy lower sodium products, often labeled as "lower sodium" or "no salt added."  Check food labels to learn how much sodium is in 1 serving.  When eating at a restaurant, ask that your food be prepared with less salt or none, if possible. READING FOOD LABELS FOR SODIUM INFORMATION The nutrition facts label is a good place to find how much sodium is in foods. Look for products with no more than 500 to 600 mg of sodium per meal and no more than 150 mg per serving. Remember that 2 g = 2000 mg. The food label may also list foods as:  Sodium-free: Less than 5 mg in a serving.  Very low sodium: 35 mg or less in a serving.  Low-sodium: 140 mg or less in a serving.  Light in sodium: 50% less sodium in a serving. For example, if a food that usually has 300 mg of sodium is changed to become light in sodium, it will have 150 mg of sodium.  Reduced sodium: 25% less sodium in a serving. For example, if a food that usually has 400 mg of sodium is changed to reduced sodium, it will have 300 mg of sodium. CHOOSING FOODS Grains  Avoid: Salted crackers and snack items. Some cereals, including instant hot cereals. Bread stuffing and biscuit mixes. Seasoned rice or pasta mixes.  Choose: Unsalted snack items. Low-sodium cereals, oats,  puffed wheat and rice, shredded wheat. English muffins and bread. Pasta. Meats  Avoid: Salted, canned, smoked, spiced, pickled meats, including fish and poultry. Bacon, ham, sausage, cold cuts, hot dogs, anchovies.  Choose: Low-sodium canned tuna and salmon. Fresh or frozen meat, poultry, and fish. Dairy  Avoid: Processed cheese and spreads. Cottage cheese. Buttermilk and condensed milk. Regular cheese.  Choose: Milk. Low-sodium cottage cheese. Yogurt. Sour cream. Low-sodium cheese. Fruits and Vegetables  Avoid: Regular canned vegetables. Regular canned tomato sauce and paste. Frozen vegetables in sauces. Olives. Pickles. Relishes. Sauerkraut.  Choose: Low-sodium canned vegetables. Low-sodium tomato sauce and paste. Frozen or fresh vegetables. Fresh and frozen fruit. Condiments  Avoid: Canned and packaged gravies. Worcestershire sauce. Tartar sauce. Barbecue sauce. Soy sauce. Steak sauce. Ketchup. Onion, garlic, and table salt. Meat flavorings and tenderizers.  Choose: Fresh and dried herbs and spices. Low-sodium varieties of mustard and ketchup. Lemon juice. Tabasco sauce. Horseradish. SAMPLE 2 GRAM SODIUM MEAL PLAN Breakfast / Sodium (mg)  1 cup low-fat milk / 143 mg  2 slices whole-wheat toast / 270 mg  1 tbs heart-healthy margarine / 153 mg  1 hard-boiled egg / 139 mg  1 small orange / 0 mg Lunch / Sodium (mg)  1 cup raw carrots / 76 mg   cup hummus / 298 mg  1 cup low-fat milk /   143 mg   cup red grapes / 2 mg  1 whole-wheat pita bread / 356 mg Dinner / Sodium (mg)  1 cup whole-wheat pasta / 2 mg  1 cup low-sodium tomato sauce / 73 mg  3 oz lean ground beef / 57 mg  1 small side salad (1 cup raw spinach leaves,  cup cucumber,  cup yellow bell pepper) with 1 tsp olive oil and 1 tsp red wine vinegar / 25 mg Snack / Sodium (mg)  1 container low-fat vanilla yogurt / 107 mg  3 graham cracker squares / 127 mg Nutrient Analysis  Calories: 2033  Protein:  77 g  Carbohydrate: 282 g  Fat: 72 g  Sodium: 1971 mg Document Released: 01/05/2005 Document Revised: 03/30/2011 Document Reviewed: 04/08/2009 Exit[employer] Patient Information 2014 [employer].  

## 2013-04-03 ENCOUNTER — Encounter: Payer: Self-pay | Admitting: Internal Medicine

## 2013-04-03 ENCOUNTER — Ambulatory Visit: Payer: Medicaid Other | Attending: Internal Medicine | Admitting: Internal Medicine

## 2013-04-03 VITALS — BP 140/90 | HR 80 | Temp 97.8°F | Resp 16 | Wt 155.8 lb

## 2013-04-03 DIAGNOSIS — I10 Essential (primary) hypertension: Secondary | ICD-10-CM

## 2013-04-03 DIAGNOSIS — R142 Eructation: Secondary | ICD-10-CM

## 2013-04-03 DIAGNOSIS — R143 Flatulence: Secondary | ICD-10-CM

## 2013-04-03 DIAGNOSIS — R141 Gas pain: Secondary | ICD-10-CM

## 2013-04-03 DIAGNOSIS — K219 Gastro-esophageal reflux disease without esophagitis: Secondary | ICD-10-CM

## 2013-04-03 DIAGNOSIS — R140 Abdominal distension (gaseous): Secondary | ICD-10-CM

## 2013-04-03 MED ORDER — SIMETHICONE 80 MG PO CHEW: 80.0000 mg | CHEWABLE_TABLET | Freq: Four times a day (QID) | ORAL | Status: DC | PRN

## 2013-04-03 MED ORDER — OMEPRAZOLE 20 MG PO CPDR: 20.0000 mg | DELAYED_RELEASE_CAPSULE | Freq: Every day | ORAL | Status: DC

## 2013-04-03 MED ORDER — AMLODIPINE BESYLATE 5 MG PO TABS: 5.0000 mg | ORAL_TABLET | Freq: Every day | ORAL | Status: DC

## 2013-04-03 NOTE — Patient Instructions (Signed)
DASH Diet  The DASH diet stands for "Dietary Approaches to Stop Hypertension." It is a healthy eating plan that has been shown to reduce high blood pressure (hypertension) in as little as 14 days, while also possibly providing other significant health benefits. These other health benefits include reducing the risk of breast cancer after menopause and reducing the risk of type 2 diabetes, heart disease, colon cancer, and stroke. Health benefits also include weight loss and slowing kidney failure in patients with chronic kidney disease.   DIET GUIDELINES  · Limit salt (sodium). Your diet should contain less than 1500 mg of sodium daily.  · Limit refined or processed carbohydrates. Your diet should include mostly whole grains. Desserts and added sugars should be used sparingly.  · Include small amounts of heart-healthy fats. These types of fats include nuts, oils, and tub margarine. Limit saturated and trans fats. These fats have been shown to be harmful in the body.  CHOOSING FOODS   The following food groups are based on a 2000 calorie diet. See your Registered Dietitian for individual calorie needs.  Grains and Grain Products (6 to 8 servings daily)  · Eat More Often: Whole-wheat bread, brown rice, whole-grain or wheat pasta, quinoa, popcorn without added fat or salt (air popped).  · Eat Less Often: White bread, white pasta, white rice, cornbread.  Vegetables (4 to 5 servings daily)  · Eat More Often: Fresh, frozen, and canned vegetables. Vegetables may be raw, steamed, roasted, or grilled with a minimal amount of fat.  · Eat Less Often/Avoid: Creamed or fried vegetables. Vegetables in a cheese sauce.  Fruit (4 to 5 servings daily)  · Eat More Often: All fresh, canned (in natural juice), or frozen fruits. Dried fruits without added sugar. One hundred percent fruit juice (½ cup [237 mL] daily).  · Eat Less Often: Dried fruits with added sugar. Canned fruit in light or heavy syrup.  Lean Meats, Fish, and Poultry (2  servings or less daily. One serving is 3 to 4 oz [85-114 g]).  · Eat More Often: Ninety percent or leaner ground beef, tenderloin, sirloin. Round cuts of beef, chicken breast, turkey breast. All fish. Grill, bake, or broil your meat. Nothing should be fried.  · Eat Less Often/Avoid: Fatty cuts of meat, turkey, or chicken leg, thigh, or wing. Fried cuts of meat or fish.  Dairy (2 to 3 servings)  · Eat More Often: Low-fat or fat-free milk, low-fat plain or light yogurt, reduced-fat or part-skim cheese.  · Eat Less Often/Avoid: Milk (whole, 2%). Whole milk yogurt. Full-fat cheeses.  Nuts, Seeds, and Legumes (4 to 5 servings per week)  · Eat More Often: All without added salt.  · Eat Less Often/Avoid: Salted nuts and seeds, canned beans with added salt.  Fats and Sweets (limited)  · Eat More Often: Vegetable oils, tub margarines without trans fats, sugar-free gelatin. Mayonnaise and salad dressings.  · Eat Less Often/Avoid: Coconut oils, palm oils, butter, stick margarine, cream, half and half, cookies, candy, pie.  FOR MORE INFORMATION  The Dash Diet Eating Plan: www.dashdiet.org  Document Released: 12/25/2010 Document Revised: 03/30/2011 Document Reviewed: 12/25/2010  Exit[employer]® Patient Information ©2014 [employer].

## 2013-04-03 NOTE — Progress Notes (Signed)
Patient here for medication refill on his blood pressure medication

## 2013-04-03 NOTE — Progress Notes (Signed)
MRN: [id number] Name: Jeremy Vazquez  Sex: male Age: 43 y.o. DOB: Jun 03, 1970  Allergies: Review of patient's allergies indicates no known allergies.  Chief Complaint  Patient presents with  . Medication Refill    HPI: Patient is 43 y.o. male who has history of hypertension, comes today for medication refill as per patient he has not taken his blood pressure medication today also has history of lot of bloating and GERD symptoms, as per patient he has tried Bentyl without much improvement also complained of some reflux symptoms, denies smoking cigarettes. Patient denies any change in bowel habits.  History reviewed. No pertinent past medical history.  History reviewed. No pertinent past surgical history.    Medication List       This list is accurate as of: 04/03/13 11:24 AM.  Always use your most recent med list.               ALKA-SELTZER PLS ALLERGY & CGH PO  Take 2 tablets by mouth once.     amLODipine 5 MG tablet  Commonly known as:  NORVASC  Take 1 tablet (5 mg total) by mouth daily.     dicyclomine 10 MG capsule  Commonly known as:  BENTYL  Take 1 capsule (10 mg total) by mouth 4 (four) times daily -  before meals and at bedtime.     omeprazole 20 MG capsule  Commonly known as:  PRILOSEC  Take 1 capsule (20 mg total) by mouth daily.     simethicone 80 MG chewable tablet  Commonly known as:  GAS-X  Chew 1 tablet (80 mg total) by mouth every 6 (six) hours as needed for flatulence.        Meds ordered this encounter  Medications  . simethicone (GAS-X) 80 MG chewable tablet    Sig: Chew 1 tablet (80 mg total) by mouth every 6 (six) hours as needed for flatulence.    Dispense:  30 tablet    Refill:  1  . omeprazole (PRILOSEC) 20 MG capsule    Sig: Take 1 capsule (20 mg total) by mouth daily.    Dispense:  30 capsule    Refill:  3  . amLODipine (NORVASC) 5 MG tablet    Sig: Take 1 tablet (5 mg total) by mouth daily.    Dispense:  90 tablet   Refill:  3    Immunization History  Administered Date(s) Administered  . Influenza,inj,Quad PF,36+ Mos 01/27/2013    History reviewed. No pertinent family history.  History  Substance Use Topics  . Smoking status: Never Smoker   . Smokeless tobacco: Not on file  . Alcohol Use: No    Review of Systems   As noted in HPI  Filed Vitals:   04/03/13 1117  BP: 140/90  Pulse:   Temp:   Resp:     Physical Exam  Physical Exam  Constitutional: No distress.  Eyes: EOM are normal. Pupils are equal, round, and reactive to light.  Cardiovascular: Normal rate and regular rhythm.   Pulmonary/Chest: Breath sounds normal. No respiratory distress. He has no wheezes. He has no rales.  Abdominal: Soft. Bowel sounds are normal. There is no tenderness. There is no rebound.    CBC    Component Value Date/Time   WBC 3.6* 11/15/2012 1023   RBC 5.70 11/15/2012 1023   HGB 16.8 11/15/2012 1023   HCT 48.0 11/15/2012 1023   PLT 262 11/15/2012 1023   MCV 84.2 11/15/2012 1023  LYMPHSABS 1.6 11/15/2012 1023   MONOABS 0.3 11/15/2012 1023   EOSABS 0.3 11/15/2012 1023   BASOSABS 0.0 11/15/2012 1023    CMP     Component Value Date/Time   NA 139 11/15/2012 1023   K 4.7 11/15/2012 1023   CL 101 11/15/2012 1023   CO2 33* 11/15/2012 1023   GLUCOSE 94 11/15/2012 1023   BUN 18 11/15/2012 1023   CREATININE 0.81 11/15/2012 1023   CREATININE 0.86 06/15/2007 2218   CALCIUM 9.9 11/15/2012 1023   PROT 7.6 11/15/2012 1023   ALBUMIN 4.7 11/15/2012 1023   AST 19 11/15/2012 1023   ALT 24 11/15/2012 1023   ALKPHOS 61 11/15/2012 1023   BILITOT 0.5 11/15/2012 1023    Lab Results  Component Value Date/Time   CHOL 185 11/15/2012 10:23 AM    No components found with this basename: hga1c    Lab Results  Component Value Date/Time   AST 19 11/15/2012 10:23 AM    Assessment and Plan  Essential hypertension, benign - Plan: Advised to continue with low salt diet, continue with Norvasc 5 mg  amLODipine (NORVASC) 5 MG tablet  GERD (gastroesophageal reflux disease) - Plan: Trial of omeprazole (PRILOSEC) 20 MG capsule  Bloating symptom - Plan: simethicone (GAS-X) 80 MG chewable tablet  Return in about 3 months (around 07/04/2013) for hypertension.  Lorayne Marek, MD

## 2013-04-28 ENCOUNTER — Ambulatory Visit: Payer: Medicaid Other | Admitting: Internal Medicine

## 2013-05-04 ENCOUNTER — Ambulatory Visit: Payer: Medicaid Other | Admitting: Internal Medicine

## 2013-06-05 ENCOUNTER — Ambulatory Visit: Payer: Medicaid Other | Attending: Internal Medicine | Admitting: *Deleted

## 2013-06-05 ENCOUNTER — Encounter: Payer: Self-pay | Admitting: *Deleted

## 2013-06-05 VITALS — BP 136/82 | HR 59 | Resp 16

## 2013-06-05 DIAGNOSIS — I10 Essential (primary) hypertension: Secondary | ICD-10-CM

## 2013-06-05 NOTE — Progress Notes (Signed)
Patient in today for Blood Pressure check. Alverda Skeans, RN

## 2013-06-28 ENCOUNTER — Encounter: Payer: Self-pay | Admitting: Internal Medicine

## 2013-06-28 ENCOUNTER — Ambulatory Visit: Payer: Medicaid Other | Attending: Internal Medicine | Admitting: Internal Medicine

## 2013-06-28 VITALS — BP 121/81 | HR 76 | Temp 97.9°F | Resp 16 | Wt 149.6 lb

## 2013-06-28 DIAGNOSIS — I10 Essential (primary) hypertension: Secondary | ICD-10-CM

## 2013-06-28 DIAGNOSIS — R198 Other specified symptoms and signs involving the digestive system and abdomen: Secondary | ICD-10-CM

## 2013-06-28 DIAGNOSIS — K219 Gastro-esophageal reflux disease without esophagitis: Secondary | ICD-10-CM

## 2013-06-28 DIAGNOSIS — R21 Rash and other nonspecific skin eruption: Secondary | ICD-10-CM

## 2013-06-28 MED ORDER — AMLODIPINE BESYLATE 5 MG PO TABS: 5.0000 mg | ORAL_TABLET | Freq: Every day | ORAL | Status: DC

## 2013-06-28 MED ORDER — TRIAMCINOLONE ACETONIDE 0.1 % EX CREA: 1.0000 "application " | TOPICAL_CREAM | Freq: Two times a day (BID) | CUTANEOUS | Status: DC

## 2013-06-28 MED ORDER — CALCIUM & MAGNESIUM CARBONATES 311-232 MG PO TABS: 1.0000 | ORAL_TABLET | Freq: Every day | ORAL | Status: DC

## 2013-06-28 NOTE — Progress Notes (Signed)
MRN: [id number] Name: Jeremy Vazquez  Sex: male Age: 43 y.o. DOB: December 20, 1970  Allergies: Review of patient's allergies indicates no known allergies.  Chief Complaint  Patient presents with  . Follow-up    HTN    HPI: Patient is 43 y.o. male who has history of hypertension, GERD comes today for followup, his blood pressure is improved denies any headache dizziness chest and shortness of breath, as per patient he took simethicone for lot of fullness but did not help patient is requesting some other medication he was also prescribed Prilosec in the past, he also reported to have rash on his right leg which sometimes is itchy, requesting something to help with the symptoms.   History reviewed. No pertinent past medical history.  History reviewed. No pertinent past surgical history.    Medication List       This list is accurate as of: 06/28/13  5:38 PM.  Always use your most recent med list.               ALKA-SELTZER PLS ALLERGY & CGH PO  Take 2 tablets by mouth once.     amLODipine 5 MG tablet  Commonly known as:  NORVASC  Take 1 tablet (5 mg total) by mouth daily.     calcium & magnesium carbonates 311-232 MG per tablet  Commonly known as:  MYLANTA  Take 1 tablet by mouth daily.     dicyclomine 10 MG capsule  Commonly known as:  BENTYL  Take 1 capsule (10 mg total) by mouth 4 (four) times daily -  before meals and at bedtime.     omeprazole 20 MG capsule  Commonly known as:  PRILOSEC  Take 1 capsule (20 mg total) by mouth daily.     simethicone 80 MG chewable tablet  Commonly known as:  GAS-X  Chew 1 tablet (80 mg total) by mouth every 6 (six) hours as needed for flatulence.     triamcinolone cream 0.1 %  Commonly known as:  KENALOG  Apply 1 application topically 2 (two) times daily.        Meds ordered this encounter  Medications  . amLODipine (NORVASC) 5 MG tablet    Sig: Take 1 tablet (5 mg total) by mouth daily.    Dispense:  90 tablet   Refill:  3  . triamcinolone cream (KENALOG) 0.1 %    Sig: Apply 1 application topically 2 (two) times daily.    Dispense:  30 g    Refill:  1  . calcium & magnesium carbonates (MYLANTA) 009-381 MG per tablet    Sig: Take 1 tablet by mouth daily.    Dispense:  30 tablet    Refill:  3    Immunization History  Administered Date(s) Administered  . Influenza,inj,Quad PF,36+ Mos 01/27/2013    History reviewed. No pertinent family history.  History  Substance Use Topics  . Smoking status: Never Smoker   . Smokeless tobacco: Not on file  . Alcohol Use: No    Review of Systems   As noted in HPI  Filed Vitals:   06/28/13 1720  BP: 121/81  Pulse: 76  Temp: 97.9 F (36.6 C)  Resp: 16    Physical Exam  Physical Exam  Constitutional: No distress.  Eyes: EOM are normal. Pupils are equal, round, and reactive to light.  Cardiovascular: Normal rate and regular rhythm.   Pulmonary/Chest: Breath sounds normal. No respiratory distress. He has no wheezes. He has no rales.  Abdominal: Soft. He exhibits no distension. There is no tenderness. There is no rebound.  Skin:  Rash on the right leg, no signs of infection.    CBC    Component Value Date/Time   WBC 3.6* 11/15/2012 1023   RBC 5.70 11/15/2012 1023   HGB 16.8 11/15/2012 1023   HCT 48.0 11/15/2012 1023   PLT 262 11/15/2012 1023   MCV 84.2 11/15/2012 1023   LYMPHSABS 1.6 11/15/2012 1023   MONOABS 0.3 11/15/2012 1023   EOSABS 0.3 11/15/2012 1023   BASOSABS 0.0 11/15/2012 1023    CMP     Component Value Date/Time   NA 139 11/15/2012 1023   K 4.7 11/15/2012 1023   CL 101 11/15/2012 1023   CO2 33* 11/15/2012 1023   GLUCOSE 94 11/15/2012 1023   BUN 18 11/15/2012 1023   CREATININE 0.81 11/15/2012 1023   CREATININE 0.86 06/15/2007 2218   CALCIUM 9.9 11/15/2012 1023   PROT 7.6 11/15/2012 1023   ALBUMIN 4.7 11/15/2012 1023   AST 19 11/15/2012 1023   ALT 24 11/15/2012 1023   ALKPHOS 61 11/15/2012 1023   BILITOT 0.5  11/15/2012 1023    Lab Results  Component Value Date/Time   CHOL 185 11/15/2012 10:23 AM    No components found with this basename: hga1c    Lab Results  Component Value Date/Time   AST 19 11/15/2012 10:23 AM    Assessment and Plan  Essential hypertension, benign - Plan: Blood pressure is well controlled Continue with her amLODipine (NORVASC) 5 MG tablet  GERD (gastroesophageal reflux disease) Prilosec.  Rash and nonspecific skin eruption - Plan: Trial of triamcinolone cream (KENALOG) 0.1 %  Abdominal fullness - Plan: calcium & magnesium carbonates (MYLANTA) 311-232 MG per tablet   Return in about 3 months (around 09/28/2013) for hypertension, gerd.  Lorayne Marek, MD

## 2013-06-28 NOTE — Progress Notes (Signed)
Patient here for follow up on his HTn and need some medication refilled

## 2013-06-29 DIAGNOSIS — K219 Gastro-esophageal reflux disease without esophagitis: Secondary | ICD-10-CM

## 2013-06-29 HISTORY — DX: Gastro-esophageal reflux disease without esophagitis: K21.9

## 2013-09-26 ENCOUNTER — Encounter: Payer: Self-pay | Admitting: Internal Medicine

## 2013-09-26 ENCOUNTER — Ambulatory Visit: Payer: Medicaid Other | Attending: Internal Medicine | Admitting: Internal Medicine

## 2013-09-26 VITALS — BP 130/80 | HR 60 | Temp 97.9°F | Ht 71.0 in | Wt 150.2 lb

## 2013-09-26 DIAGNOSIS — I10 Essential (primary) hypertension: Secondary | ICD-10-CM

## 2013-09-26 DIAGNOSIS — E785 Hyperlipidemia, unspecified: Secondary | ICD-10-CM

## 2013-09-26 DIAGNOSIS — K219 Gastro-esophageal reflux disease without esophagitis: Secondary | ICD-10-CM

## 2013-09-26 LAB — LIPID PANEL
Cholesterol: 196 mg/dL (ref 0–200)
HDL: 43 mg/dL (ref >39)
LDL Cholesterol: 132 mg/dL — ABNORMAL HIGH (ref 0–99)
Total CHOL/HDL Ratio: 4.6 Ratio
Triglycerides: 105 mg/dL (ref <150)
VLDL: 21 mg/dL (ref 0–40)

## 2013-09-26 LAB — COMPLETE METABOLIC PANEL WITH GFR
ALT: 19 U/L (ref 0–53)
AST: 16 U/L (ref 0–37)
Albumin: 4.9 g/dL (ref 3.5–5.2)
Alkaline Phosphatase: 50 U/L (ref 39–117)
BUN: 18 mg/dL (ref 6–23)
CO2: 28 mEq/L (ref 19–32)
Calcium: 9.7 mg/dL (ref 8.4–10.5)
Chloride: 102 mEq/L (ref 96–112)
Creat: 0.83 mg/dL (ref 0.50–1.35)
GFR, Est African American: >89 mL/min
GFR, Est Non African American: >89 mL/min
Glucose, Bld: 90 mg/dL (ref 70–99)
Potassium: 3.9 mEq/L (ref 3.5–5.3)
Sodium: 137 mEq/L (ref 135–145)
Total Bilirubin: 0.5 mg/dL (ref 0.2–1.2)
Total Protein: 7.8 g/dL (ref 6.0–8.3)

## 2013-09-26 MED ORDER — AMLODIPINE BESYLATE 5 MG PO TABS: 5.0000 mg | ORAL_TABLET | Freq: Every day | ORAL | Status: DC

## 2013-09-26 NOTE — Progress Notes (Signed)
MRN: [id number] Name: Jeremy Vazquez  Sex: male Age: 43 y.o. DOB: April 07, 1970  Allergies: Review of patient's allergies indicates no known allergies.  Chief Complaint  Patient presents with  . Hypertension    HPI: Patient is 43 y.o. male who has to of hypertension hyperlipidemia, GERD comes today for followup, denies any acute symptoms as per patient is compliant with taking his medications his manual blood pressure today is 130/80, patient is fasting today, previous blood work reviewed noticed elevated triglycerides. Patient has modified his diet.  No past medical history on file.  No past surgical history on file.    Medication List       This list is accurate as of: 09/26/13  5:29 PM.  Always use your most recent med list.               ALKA-SELTZER PLS ALLERGY & CGH PO  Take 2 tablets by mouth once.     amLODipine 5 MG tablet  Commonly known as:  NORVASC  Take 1 tablet (5 mg total) by mouth daily.     calcium & magnesium carbonates 311-232 MG per tablet  Commonly known as:  MYLANTA  Take 1 tablet by mouth daily.     dicyclomine 10 MG capsule  Commonly known as:  BENTYL  Take 1 capsule (10 mg total) by mouth 4 (four) times daily -  before meals and at bedtime.     omeprazole 20 MG capsule  Commonly known as:  PRILOSEC  Take 1 capsule (20 mg total) by mouth daily.     simethicone 80 MG chewable tablet  Commonly known as:  GAS-X  Chew 1 tablet (80 mg total) by mouth every 6 (six) hours as needed for flatulence.     triamcinolone cream 0.1 %  Commonly known as:  KENALOG  Apply 1 application topically 2 (two) times daily.        Meds ordered this encounter  Medications  . amLODipine (NORVASC) 5 MG tablet    Sig: Take 1 tablet (5 mg total) by mouth daily.    Dispense:  90 tablet    Refill:  3    Immunization History  Administered Date(s) Administered  . Influenza,inj,Quad PF,36+ Mos 01/27/2013    No family history on file.  History    Substance Use Topics  . Smoking status: Never Smoker   . Smokeless tobacco: Not on file  . Alcohol Use: No    Review of Systems   As noted in HPI  Filed Vitals:   09/26/13 1721  BP: 130/80  Pulse:   Temp:     Physical Exam  Physical Exam  Constitutional: No distress.  Eyes: EOM are normal. Pupils are equal, round, and reactive to light.  Cardiovascular: Normal rate and regular rhythm.   Pulmonary/Chest: Breath sounds normal. No respiratory distress. He has no wheezes. He has no rales.  Musculoskeletal: He exhibits no edema.    CBC    Component Value Date/Time   WBC 3.6* 11/15/2012 1023   RBC 5.70 11/15/2012 1023   HGB 16.8 11/15/2012 1023   HCT 48.0 11/15/2012 1023   PLT 262 11/15/2012 1023   MCV 84.2 11/15/2012 1023   LYMPHSABS 1.6 11/15/2012 1023   MONOABS 0.3 11/15/2012 1023   EOSABS 0.3 11/15/2012 1023   BASOSABS 0.0 11/15/2012 1023    CMP     Component Value Date/Time   NA 139 11/15/2012 1023   K 4.7 11/15/2012 1023   CL 101  11/15/2012 1023   CO2 33* 11/15/2012 1023   GLUCOSE 94 11/15/2012 1023   BUN 18 11/15/2012 1023   CREATININE 0.81 11/15/2012 1023   CREATININE 0.86 06/15/2007 2218   CALCIUM 9.9 11/15/2012 1023   PROT 7.6 11/15/2012 1023   ALBUMIN 4.7 11/15/2012 1023   AST 19 11/15/2012 1023   ALT 24 11/15/2012 1023   ALKPHOS 61 11/15/2012 1023   BILITOT 0.5 11/15/2012 1023    Lab Results  Component Value Date/Time   CHOL 185 11/15/2012 10:23 AM    No components found with this basename: hga1c    Lab Results  Component Value Date/Time   AST 19 11/15/2012 10:23 AM    Assessment and Plan  Essential hypertension, benign - Plan: Advised to continue with DASH diet, amlodipine will repeat COMPLETE METABOLIC PANEL WITH GFR, amLODipine (NORVASC) 5 MG tablet  Other and unspecified hyperlipidemia - Plan: Will repeat Lipid panel  Gastroesophageal reflux disease, esophagitis presence not specified  Lifestyle modification continue with  Prilosec.  Return in about 4 months (around 01/26/2014) for hypertension, hyperipidemia.  Lorayne Marek, MD

## 2013-09-26 NOTE — Progress Notes (Signed)
Patient is here for a HTN follow up. Stats that he takes him BP medication every morning at 10am with no doses missed.

## 2013-09-26 NOTE — Patient Instructions (Signed)
DASH Eating Plan °DASH stands for "Dietary Approaches to Stop Hypertension." The DASH eating plan is a healthy eating plan that has been shown to reduce high blood pressure (hypertension). Additional health benefits may include reducing the risk of type 2 diabetes mellitus, heart disease, and stroke. The DASH eating plan may also help with weight loss. °WHAT DO I NEED TO KNOW ABOUT THE DASH EATING PLAN? °For the DASH eating plan, you will follow these general guidelines: °· Choose foods with a percent daily value for sodium of less than 5% (as listed on the food label). °· Use salt-free seasonings or herbs instead of table salt or sea salt. °· Check with your health care provider or pharmacist before using salt substitutes. °· Eat lower-sodium products, often labeled as "lower sodium" or "no salt added." °· Eat fresh foods. °· Eat more vegetables, fruits, and low-fat dairy products. °· Choose whole grains. Look for the word "whole" as the first word in the ingredient list. °· Choose fish and skinless chicken or turkey more often than red meat. Limit fish, poultry, and meat to 6 oz (170 g) each day. °· Limit sweets, desserts, sugars, and sugary drinks. °· Choose heart-healthy fats. °· Limit cheese to 1 oz (28 g) per day. °· Eat more home-cooked food and less restaurant, buffet, and fast food. °· Limit fried foods. °· Cook foods using methods other than frying. °· Limit canned vegetables. If you do use them, rinse them well to decrease the sodium. °· When eating at a restaurant, ask that your food be prepared with less salt, or no salt if possible. °WHAT FOODS CAN I EAT? °Seek help from a dietitian for individual calorie needs. °Grains °Whole grain or whole wheat bread. Brown rice. Whole grain or whole wheat pasta. Quinoa, bulgur, and whole grain cereals. Low-sodium cereals. Corn or whole wheat flour tortillas. Whole grain cornbread. Whole grain crackers. Low-sodium crackers. °Vegetables °Fresh or frozen vegetables  (raw, steamed, roasted, or grilled). Low-sodium or reduced-sodium tomato and vegetable juices. Low-sodium or reduced-sodium tomato sauce and paste. Low-sodium or reduced-sodium canned vegetables.  °Fruits °All fresh, canned (in natural juice), or frozen fruits. °Meat and Other Protein Products °Ground beef (85% or leaner), grass-fed beef, or beef trimmed of fat. Skinless chicken or turkey. Ground chicken or turkey. Pork trimmed of fat. All fish and seafood. Eggs. Dried beans, peas, or lentils. Unsalted nuts and seeds. Unsalted canned beans. °Dairy °Low-fat dairy products, such as skim or 1% milk, 2% or reduced-fat cheeses, low-fat ricotta or cottage cheese, or plain low-fat yogurt. Low-sodium or reduced-sodium cheeses. °Fats and Oils °Tub margarines without trans fats. Light or reduced-fat mayonnaise and salad dressings (reduced sodium). Avocado. Safflower, olive, or canola oils. Natural peanut or almond butter. °Other °Unsalted popcorn and pretzels. °The items listed above may not be a complete list of recommended foods or beverages. Contact your dietitian for more options. °WHAT FOODS ARE NOT RECOMMENDED? °Grains °White bread. White pasta. White rice. Refined cornbread. Bagels and croissants. Crackers that contain trans fat. °Vegetables °Creamed or fried vegetables. Vegetables in a cheese sauce. Regular canned vegetables. Regular canned tomato sauce and paste. Regular tomato and vegetable juices. °Fruits °Dried fruits. Canned fruit in light or heavy syrup. Fruit juice. °Meat and Other Protein Products °Fatty cuts of meat. Ribs, chicken wings, bacon, sausage, bologna, salami, chitterlings, fatback, hot dogs, bratwurst, and packaged luncheon meats. Salted nuts and seeds. Canned beans with salt. °Dairy °Whole or 2% milk, cream, half-and-half, and cream cheese. Whole-fat or sweetened yogurt. Full-fat   cheeses or blue cheese. Nondairy creamers and whipped toppings. Processed cheese, cheese spreads, or cheese  curds. °Condiments °Onion and garlic salt, seasoned salt, table salt, and sea salt. Canned and packaged gravies. Worcestershire sauce. Tartar sauce. Barbecue sauce. Teriyaki sauce. Soy sauce, including reduced sodium. Steak sauce. Fish sauce. Oyster sauce. Cocktail sauce. Horseradish. Ketchup and mustard. Meat flavorings and tenderizers. Bouillon cubes. Hot sauce. Tabasco sauce. Marinades. Taco seasonings. Relishes. °Fats and Oils °Butter, stick margarine, lard, shortening, ghee, and bacon fat. Coconut, palm kernel, or palm oils. Regular salad dressings. °Other °Pickles and olives. Salted popcorn and pretzels. °The items listed above may not be a complete list of foods and beverages to avoid. Contact your dietitian for more information. °WHERE CAN I FIND MORE INFORMATION? °National Heart, Lung, and [employer] Institute: www.nhlbi.nih.gov/health/health-topics/topics/dash/ °Document Released: 12/25/2010 Document Revised: 05/22/2013 Document Reviewed: 11/09/2012 °ExitCare® Patient Information ©2015 [employer]. This information is not intended to replace advice given to you by your health care provider. Make sure you discuss any questions you have with your health care provider. ° °

## 2014-06-08 ENCOUNTER — Inpatient Hospital Stay (HOSPITAL_COMMUNITY)
Admission: EM | Admit: 2014-06-08 | Discharge: 2014-06-10 | DRG: 247 | Disposition: A | Discharge status: Home / Self Care | Payer: BLUE CROSS/BLUE SHIELD | Attending: Interventional Cardiology | Admitting: Interventional Cardiology

## 2014-06-08 ENCOUNTER — Other Ambulatory Visit (HOSPITAL_COMMUNITY): Payer: Self-pay

## 2014-06-08 ENCOUNTER — Emergency Department (HOSPITAL_COMMUNITY): Payer: BLUE CROSS/BLUE SHIELD

## 2014-06-08 ENCOUNTER — Ambulatory Visit (HOSPITAL_COMMUNITY): Admit: 2014-06-08 | Payer: Self-pay | Admitting: Interventional Cardiology

## 2014-06-08 ENCOUNTER — Encounter (HOSPITAL_COMMUNITY): Payer: Self-pay | Admitting: Emergency Medicine

## 2014-06-08 ENCOUNTER — Encounter (HOSPITAL_COMMUNITY)
Admission: EM | Disposition: A | Discharge status: Home / Self Care | Payer: Medicaid Other | Source: Home / Self Care | Attending: Interventional Cardiology

## 2014-06-08 DIAGNOSIS — I25110 Atherosclerotic heart disease of native coronary artery with unstable angina pectoris: Secondary | ICD-10-CM | POA: Diagnosis not present

## 2014-06-08 DIAGNOSIS — I2102 ST elevation (STEMI) myocardial infarction involving left anterior descending coronary artery: Principal | ICD-10-CM | POA: Diagnosis present

## 2014-06-08 DIAGNOSIS — I255 Ischemic cardiomyopathy: Secondary | ICD-10-CM

## 2014-06-08 DIAGNOSIS — I252 Old myocardial infarction: Secondary | ICD-10-CM

## 2014-06-08 DIAGNOSIS — E785 Hyperlipidemia, unspecified: Secondary | ICD-10-CM

## 2014-06-08 DIAGNOSIS — I2510 Atherosclerotic heart disease of native coronary artery without angina pectoris: Secondary | ICD-10-CM | POA: Diagnosis present

## 2014-06-08 DIAGNOSIS — I259 Chronic ischemic heart disease, unspecified: Secondary | ICD-10-CM | POA: Diagnosis not present

## 2014-06-08 DIAGNOSIS — Z713 Dietary counseling and surveillance: Secondary | ICD-10-CM | POA: Diagnosis not present

## 2014-06-08 DIAGNOSIS — Z955 Presence of coronary angioplasty implant and graft: Secondary | ICD-10-CM

## 2014-06-08 DIAGNOSIS — R079 Chest pain, unspecified: Secondary | ICD-10-CM | POA: Diagnosis not present

## 2014-06-08 DIAGNOSIS — I10 Essential (primary) hypertension: Secondary | ICD-10-CM | POA: Diagnosis present

## 2014-06-08 DIAGNOSIS — Z79899 Other long term (current) drug therapy: Secondary | ICD-10-CM

## 2014-06-08 DIAGNOSIS — I213 ST elevation (STEMI) myocardial infarction of unspecified site: Secondary | ICD-10-CM

## 2014-06-08 DIAGNOSIS — I214 Non-ST elevation (NSTEMI) myocardial infarction: Secondary | ICD-10-CM | POA: Diagnosis not present

## 2014-06-08 HISTORY — DX: Atherosclerotic heart disease of native coronary artery without angina pectoris: I25.10

## 2014-06-08 HISTORY — PX: CARDIAC CATHETERIZATION: SHX172

## 2014-06-08 HISTORY — DX: Old myocardial infarction: I25.2

## 2014-06-08 HISTORY — DX: Ischemic cardiomyopathy: I25.5

## 2014-06-08 HISTORY — DX: Hyperlipidemia, unspecified: E78.5

## 2014-06-08 HISTORY — DX: Essential (primary) hypertension: I10

## 2014-06-08 LAB — CBC
HCT: 48.3 % (ref 39.0–52.0)
HCT: 44.3 % (ref 39.0–52.0)
Hemoglobin: 16.9 g/dL (ref 13.0–17.0)
Hemoglobin: 15.6 g/dL (ref 13.0–17.0)
MCH: 29.5 pg (ref 26.0–34.0)
MCH: 29.5 pg (ref 26.0–34.0)
MCHC: 35 g/dL (ref 30.0–36.0)
MCHC: 35.2 g/dL (ref 30.0–36.0)
MCV: 84.4 fL (ref 78.0–100.0)
MCV: 83.7 fL (ref 78.0–100.0)
Platelets: 234 10*3/uL (ref 150–400)
Platelets: 239 10*3/uL (ref 150–400)
RBC: 5.72 MIL/uL (ref 4.22–5.81)
RBC: 5.29 MIL/uL (ref 4.22–5.81)
RDW: 12.3 % (ref 11.5–15.5)
RDW: 12.4 % (ref 11.5–15.5)
WBC: 5.1 10*3/uL (ref 4.0–10.5)
WBC: 5.9 10*3/uL (ref 4.0–10.5)

## 2014-06-08 LAB — COMPREHENSIVE METABOLIC PANEL
ALT: 22 U/L (ref 17–63)
AST: 58 U/L — ABNORMAL HIGH (ref 15–41)
Albumin: 3.6 g/dL (ref 3.5–5.0)
Alkaline Phosphatase: 51 U/L (ref 38–126)
Anion gap: 8 (ref 5–15)
BUN: 14 mg/dL (ref 6–20)
CO2: 26 mmol/L (ref 22–32)
Calcium: 9 mg/dL (ref 8.9–10.3)
Chloride: 102 mmol/L (ref 101–111)
Creatinine, Ser: 0.77 mg/dL (ref 0.61–1.24)
GFR calc Af Amer: >60 mL/min (ref >60)
GFR calc non Af Amer: >60 mL/min (ref >60)
Glucose, Bld: 124 mg/dL — ABNORMAL HIGH (ref 65–99)
Potassium: 3.9 mmol/L (ref 3.5–5.1)
Sodium: 136 mmol/L (ref 135–145)
Total Bilirubin: 0.5 mg/dL (ref 0.3–1.2)
Total Protein: 6.7 g/dL (ref 6.5–8.1)

## 2014-06-08 LAB — LIPID PANEL
Cholesterol: 176 mg/dL (ref 0–200)
HDL: 35 mg/dL — ABNORMAL LOW (ref >40)
LDL Cholesterol: 100 mg/dL — ABNORMAL HIGH (ref 0–99)
Total CHOL/HDL Ratio: 5 RATIO
Triglycerides: 204 mg/dL — ABNORMAL HIGH (ref <150)
VLDL: 41 mg/dL — ABNORMAL HIGH (ref 0–40)

## 2014-06-08 LAB — BRAIN NATRIURETIC PEPTIDE: B Natriuretic Peptide: 45.1 pg/mL (ref 0.0–100.0)

## 2014-06-08 LAB — BASIC METABOLIC PANEL
Anion gap: 12 (ref 5–15)
BUN: 17 mg/dL (ref 6–20)
CO2: 23 mmol/L (ref 22–32)
Calcium: 9.6 mg/dL (ref 8.9–10.3)
Chloride: 102 mmol/L (ref 101–111)
Creatinine, Ser: 0.83 mg/dL (ref 0.61–1.24)
GFR calc Af Amer: >60 mL/min (ref >60)
GFR calc non Af Amer: >60 mL/min (ref >60)
Glucose, Bld: 111 mg/dL — ABNORMAL HIGH (ref 65–99)
Potassium: 3.5 mmol/L (ref 3.5–5.1)
Sodium: 137 mmol/L (ref 135–145)

## 2014-06-08 LAB — PROTIME-INR
INR: 1.07 (ref 0.00–1.49)
Prothrombin Time: 14.1 seconds (ref 11.6–15.2)

## 2014-06-08 LAB — TSH: TSH: 0.916 u[IU]/mL (ref 0.350–4.500)

## 2014-06-08 LAB — TROPONIN I
Troponin I: 9.69 ng/mL (ref <0.031)
Troponin I: 14.77 ng/mL (ref <0.031)
Troponin I: 17.64 ng/mL (ref <0.031)

## 2014-06-08 LAB — I-STAT TROPONIN, ED: Troponin i, poc: 0.09 ng/mL (ref 0.00–0.08)

## 2014-06-08 LAB — APTT: aPTT: 30 seconds (ref 24–37)

## 2014-06-08 LAB — POCT ACTIVATED CLOTTING TIME: Activated Clotting Time: 577 seconds

## 2014-06-08 LAB — MRSA PCR SCREENING: MRSA by PCR: NEGATIVE

## 2014-06-08 SURGERY — LEFT HEART CATH AND CORONARY ANGIOGRAPHY
Anesthesia: LOCAL

## 2014-06-08 MED ORDER — BIVALIRUDIN BOLUS VIA INFUSION - CUPID
INTRAVENOUS | Status: DC | PRN
  Administered 2014-06-08: 53.775 mg via INTRAVENOUS

## 2014-06-08 MED ORDER — FENTANYL CITRATE (PF) 100 MCG/2ML IJ SOLN
INTRAMUSCULAR | Status: DC | PRN
  Administered 2014-06-08: 50 ug via INTRAVENOUS

## 2014-06-08 MED ORDER — BIVALIRUDIN 250 MG IV SOLR
INTRAVENOUS | Status: AC
  Filled 2014-06-08: qty 250

## 2014-06-08 MED ORDER — HEPARIN (PORCINE) IN NACL 2-0.9 UNIT/ML-% IJ SOLN
INTRAMUSCULAR | Status: AC
  Filled 2014-06-08: qty 1000

## 2014-06-08 MED ORDER — OXYCODONE-ACETAMINOPHEN 5-325 MG PO TABS: 1.0000 | ORAL_TABLET | ORAL | Status: DC | PRN

## 2014-06-08 MED ORDER — HEPARIN SODIUM (PORCINE) 5000 UNIT/ML IJ SOLN
4000.0000 [IU] | Freq: Once | INTRAMUSCULAR | Status: AC
  Administered 2014-06-08: 4000 [IU] via INTRAVENOUS

## 2014-06-08 MED ORDER — LISINOPRIL 2.5 MG PO TABS
1.2500 mg | ORAL_TABLET | Freq: Every day | ORAL | Status: DC
  Administered 2014-06-08: 1.25 mg via ORAL
  Filled 2014-06-08: qty 0.5

## 2014-06-08 MED ORDER — ACETAMINOPHEN 325 MG PO TABS: 650.0000 mg | ORAL_TABLET | ORAL | Status: DC | PRN

## 2014-06-08 MED ORDER — SODIUM CHLORIDE 0.9 % IJ SOLN
3.0000 mL | Freq: Two times a day (BID) | INTRAMUSCULAR | Status: DC
  Administered 2014-06-08 – 2014-06-10 (×4): 3 mL via INTRAVENOUS

## 2014-06-08 MED ORDER — VERAPAMIL HCL 2.5 MG/ML IV SOLN
INTRAVENOUS | Status: DC | PRN
  Administered 2014-06-08: 03:00:00 via INTRA_ARTERIAL

## 2014-06-08 MED ORDER — SODIUM CHLORIDE 0.9 % IJ SOLN: 3.0000 mL | INTRAMUSCULAR | Status: DC | PRN

## 2014-06-08 MED ORDER — SODIUM CHLORIDE 0.9 % IV SOLN
INTRAVENOUS | Status: DC
  Administered 2014-06-08: 02:00:00 via INTRAVENOUS

## 2014-06-08 MED ORDER — ONDANSETRON HCL 4 MG/2ML IJ SOLN: 4.0000 mg | Freq: Four times a day (QID) | INTRAMUSCULAR | Status: DC | PRN

## 2014-06-08 MED ORDER — SODIUM CHLORIDE 0.9 % WEIGHT BASED INFUSION
1.0000 mL/kg/h | INTRAVENOUS | Status: AC
  Administered 2014-06-08: 1 mL/kg/h via INTRAVENOUS

## 2014-06-08 MED ORDER — ATORVASTATIN CALCIUM 80 MG PO TABS
80.0000 mg | ORAL_TABLET | Freq: Every day | ORAL | Status: DC
  Administered 2014-06-08 – 2014-06-09 (×2): 80 mg via ORAL
  Filled 2014-06-08 (×3): qty 1

## 2014-06-08 MED ORDER — NITROGLYCERIN 1 MG/10 ML FOR IR/CATH LAB
INTRA_ARTERIAL | Status: AC
  Filled 2014-06-08: qty 10

## 2014-06-08 MED ORDER — LISINOPRIL 2.5 MG PO TABS
2.5000 mg | ORAL_TABLET | Freq: Every day | ORAL | Status: DC
  Administered 2014-06-09 – 2014-06-10 (×2): 2.5 mg via ORAL
  Filled 2014-06-08 (×2): qty 1

## 2014-06-08 MED ORDER — ASPIRIN 81 MG PO CHEW
324.0000 mg | CHEWABLE_TABLET | Freq: Once | ORAL | Status: AC
  Administered 2014-06-08: 324 mg via ORAL

## 2014-06-08 MED ORDER — ASPIRIN 81 MG PO CHEW
81.0000 mg | CHEWABLE_TABLET | Freq: Every day | ORAL | Status: DC
  Administered 2014-06-08 – 2014-06-10 (×3): 81 mg via ORAL
  Filled 2014-06-08 (×3): qty 1

## 2014-06-08 MED ORDER — LIDOCAINE HCL (PF) 1 % IJ SOLN
INTRAMUSCULAR | Status: AC
  Filled 2014-06-08: qty 30

## 2014-06-08 MED ORDER — MIDAZOLAM HCL 2 MG/2ML IJ SOLN
INTRAMUSCULAR | Status: DC | PRN
  Administered 2014-06-08: 1 mg via INTRAVENOUS

## 2014-06-08 MED ORDER — IOHEXOL 350 MG/ML SOLN
INTRAVENOUS | Status: DC | PRN
  Administered 2014-06-08: 125 mL via INTRA_ARTERIAL

## 2014-06-08 MED ORDER — MIDAZOLAM HCL 2 MG/2ML IJ SOLN
INTRAMUSCULAR | Status: AC
  Filled 2014-06-08: qty 2

## 2014-06-08 MED ORDER — TICAGRELOR 90 MG PO TABS
ORAL_TABLET | ORAL | Status: AC
  Filled 2014-06-08: qty 1

## 2014-06-08 MED ORDER — TICAGRELOR 90 MG PO TABS
ORAL_TABLET | ORAL | Status: DC | PRN
  Administered 2014-06-08: 180 mg via ORAL

## 2014-06-08 MED ORDER — SODIUM CHLORIDE 0.9 % IV SOLN: 250.0000 mL | INTRAVENOUS | Status: DC | PRN

## 2014-06-08 MED ORDER — SODIUM CHLORIDE 0.9 % IV SOLN
250.0000 mg | INTRAVENOUS | Status: DC | PRN
  Administered 2014-06-08: 1.75 mg/kg/h via INTRAVENOUS

## 2014-06-08 MED ORDER — METOPROLOL SUCCINATE 12.5 MG HALF TABLET
12.5000 mg | ORAL_TABLET | Freq: Every day | ORAL | Status: DC
  Administered 2014-06-08: 12.5 mg via ORAL
  Filled 2014-06-08: qty 1

## 2014-06-08 MED ORDER — TICAGRELOR 90 MG PO TABS
90.0000 mg | ORAL_TABLET | Freq: Two times a day (BID) | ORAL | Status: DC
  Administered 2014-06-08 – 2014-06-10 (×4): 90 mg via ORAL
  Filled 2014-06-08 (×6): qty 1

## 2014-06-08 MED ORDER — VERAPAMIL HCL 2.5 MG/ML IV SOLN
INTRAVENOUS | Status: AC
  Filled 2014-06-08: qty 2

## 2014-06-08 MED ORDER — IOHEXOL 350 MG/ML SOLN
INTRAVENOUS | Status: DC | PRN
  Administered 2014-06-08 (×2): 125 mL via INTRAVENOUS

## 2014-06-08 MED ORDER — HEPARIN SODIUM (PORCINE) 1000 UNIT/ML IJ SOLN
INTRAMUSCULAR | Status: AC
  Filled 2014-06-08: qty 1

## 2014-06-08 MED ORDER — ASPIRIN 81 MG PO CHEW
CHEWABLE_TABLET | ORAL | Status: AC
  Filled 2014-06-08: qty 4

## 2014-06-08 MED ORDER — METOPROLOL SUCCINATE ER 25 MG PO TB24
25.0000 mg | ORAL_TABLET | Freq: Every day | ORAL | Status: DC
  Administered 2014-06-09 – 2014-06-10 (×2): 25 mg via ORAL
  Filled 2014-06-08 (×2): qty 1

## 2014-06-08 MED ORDER — FENTANYL CITRATE (PF) 100 MCG/2ML IJ SOLN
INTRAMUSCULAR | Status: AC
  Filled 2014-06-08: qty 2

## 2014-06-08 MED ORDER — NITROGLYCERIN 0.4 MG SL SUBL
0.4000 mg | SUBLINGUAL_TABLET | SUBLINGUAL | Status: DC | PRN
  Administered 2014-06-08 (×3): 0.4 mg via SUBLINGUAL

## 2014-06-08 SURGICAL SUPPLY — 20 items
BALLN ~~LOC~~ EUPHORA RX 3.75X8 (BALLOONS) ×2
BALLN ~~LOC~~ TREK RX 3.5X12 (BALLOONS) ×2
BALLOON ~~LOC~~ EUPHORA RX 3.75X8 (BALLOONS) IMPLANT
BALLOON ~~LOC~~ TREK RX 3.5X12 (BALLOONS) IMPLANT
CATH INFINITI JR4 5F (CATHETERS) ×2 IMPLANT
CATH PRIORITY ONE AC 6F (CATHETERS) ×1 IMPLANT
CATH SITESEER 5F MULTI A 2 (CATHETERS) IMPLANT
CATH VISTA GUIDE 6FR XBLAD3.5 (CATHETERS) ×1 IMPLANT
DEVICE RAD COMP TR BAND LRG (VASCULAR PRODUCTS) ×2 IMPLANT
GLIDESHEATH SLEND A-KIT 6F 22G (SHEATH) ×2 IMPLANT
KIT ENCORE 26 ADVANTAGE (KITS) ×1 IMPLANT
KIT HEART LEFT (KITS) ×2 IMPLANT
PACK CARDIAC CATHETERIZATION (CUSTOM PROCEDURE TRAY) ×2 IMPLANT
SHEATH PINNACLE 5F 10CM (SHEATH) IMPLANT
STENT PROMUS PREM MR 3.0X16 (Permanent Stent) ×1 IMPLANT
TRANSDUCER W/STOPCOCK (MISCELLANEOUS) ×2 IMPLANT
TUBING CIL FLEX 10 FLL-RA (TUBING) ×2 IMPLANT
WIRE ASAHI PROWATER 180CM (WIRE) ×1 IMPLANT
WIRE EMERALD 3MM-J .035X150CM (WIRE) IMPLANT
WIRE SAFE-T 1.5MM-J .035X260CM (WIRE) ×2 IMPLANT

## 2014-06-08 NOTE — Progress Notes (Signed)
5/20 1011a gave pt 30day free card for brilinta. Pt has medicaid so copay will be around 3.00 per month.

## 2014-06-08 NOTE — Progress Notes (Signed)
CARDIAC REHAB PHASE I   Pt c/o of mild chest pain 3-4/10. Pt. States he was moving around, used rest room, sat in chair and ate lunch and thinks that the pain is associated with movement. Pt. Did have chest pain earlier after using rest room that was 1-2/10 in which he notified nurse. Will hold ambulation for now and will f/u tomorrow. Did leave MI book for education.  [place] 06/08/2014 2:04 PM

## 2014-06-08 NOTE — Progress Notes (Signed)
Notified by cardiac rehab that pt was c/o of mild chest pain 4/10.  Pt had just been sitting in chair eating lunch and returned to bed.  Went to bedside and pt said chest pain was better.  Placed pt on 2L O2 and obtained 12 lead ecg.  Dr. Tamala Julian notified.  Will continue to monitor closely.

## 2014-06-08 NOTE — ED Notes (Signed)
To cath lab with RN and EMT 

## 2014-06-08 NOTE — ED Notes (Signed)
Escorted to cath lab by this RN and Dr. Tamala Julian

## 2014-06-08 NOTE — Progress Notes (Signed)
Dr. Tamala Julian notified of troponin results

## 2014-06-08 NOTE — ED Provider Notes (Signed)
This chart was scribed for [place], DO by Meriel Pica, ED Scribe. This patient was seen in room A12C/A12C and the patient's care was started 2:04 AM.  TIME SEEN: 2:04 AM  CHIEF COMPLAINT: Chest pain  HPI Comments: Jeremy Vazquez is a 44 y.o. male with HTN, previous history of hyperlipidemia no longer on medication who presents to the Emergency Department complaining of intermittent, non radiating, central chest pain for the past two days. Exacerbated by movement, walking. Pt reports associated symptoms of diaphoresis and nausea, vomiting. Denies shortness of breath. He has not taken anything for symptom relief. He denies history of prior similar episodes. He is currently on medication for HTN. Pt has no history of smoking or heart disease. Denies history of PE or DVT. No recent prolonged immobilization, fracture, surgery, trauma. No calf tenderness or swelling. No fever or cough. No history of stress test or cardiac catheterization. Does not have a cardiologist.   ROS: See HPI Constitutional: no fever  Eyes: no drainage  ENT: no runny nose   Cardiovascular: chest pain  Resp: no SOB  GI: no vomiting GU: no dysuria Integumentary: no rash  Allergy: no hives  Musculoskeletal: no leg swelling  Neurological: no slurred speech ROS otherwise negative  PAST MEDICAL HISTORY/PAST SURGICAL HISTORY:  Past Medical History  Diagnosis Date  . Hypertension     MEDICATIONS:  Prior to Admission medications   Medication Sig Start Date End Date Taking? Authorizing Provider  amLODipine (NORVASC) 5 MG tablet Take 1 tablet (5 mg total) by mouth daily. 09/26/13   Lorayne Marek, MD  calcium & magnesium carbonates (MYLANTA) 462-703 MG per tablet Take 1 tablet by mouth daily. 06/28/13   Lorayne Marek, MD  dicyclomine (BENTYL) 10 MG capsule Take 1 capsule (10 mg total) by mouth 4 (four) times daily -  before meals and at bedtime. 12/27/12   Tresa Garter, MD  omeprazole (PRILOSEC) 20 MG  capsule Take 1 capsule (20 mg total) by mouth daily. 04/03/13   Lorayne Marek, MD  Phenyleph-Doxylamine-DM-APAP (ALKA-SELTZER PLS ALLERGY & [facility] PO) Take 2 tablets by mouth once.    Historical Provider, MD  simethicone (GAS-X) 80 MG chewable tablet Chew 1 tablet (80 mg total) by mouth every 6 (six) hours as needed for flatulence. 04/03/13   Lorayne Marek, MD  triamcinolone cream (KENALOG) 0.1 % Apply 1 application topically 2 (two) times daily. 06/28/13   Lorayne Marek, MD    ALLERGIES:  No Known Allergies  SOCIAL HISTORY:  History  Substance Use Topics  . Smoking status: Never Smoker   . Smokeless tobacco: Not on file  . Alcohol Use: No    FAMILY HISTORY: No family history on file.  EXAM: Vitals Triage: BP 144/97 mmHg  Pulse 61  Temp(Src) 97.7 F (36.5 C) (Oral)  Resp 16  Ht 6' (1.829 m)  Wt 158 lb (71.668 kg)  BMI 21.42 kg/m2  SpO2 100%  CONSTITUTIONAL: Alert and oriented and responds appropriately to questions. Appears uncomfortable but nontoxic HEAD: Normocephalic EYES: Conjunctivae clear, PERRL ENT: normal nose; no rhinorrhea; moist mucous membranes; pharynx without lesions noted NECK: Supple, no meningismus, no LAD  CARD: RRR; S1 and S2 appreciated; no murmurs, no clicks, no rubs, no gallops RESP: Normal chest excursion without splinting or tachypnea; breath sounds clear and equal bilaterally; no wheezes, no rhonchi, no rales, no hypoxia or respiratory distress, speaking full sentences ABD/GI: Normal bowel sounds; non-distended; soft, non-tender, no rebound, no guarding, no peritoneal signs BACK:  The  back appears normal and is non-tender to palpation, there is no CVA tenderness EXT: Normal ROM in all joints; non-tender to palpation; no edema; normal capillary refill; no cyanosis, no calf tenderness or swelling    SKIN: Normal color for age and race; warm NEURO: Moves all extremities equally, sensation to light touch intact diffusely, cranial nerves II through XII  intact PSYCH: The patient's mood and manner are appropriate. Grooming and personal hygiene are appropriate.  MEDICAL DECISION MAKING: Patient here with anterior lateral ST elevation MI. Initial EKG at 1:56 AM showed mild ST elevation in lateral leads and peaked T waves in anterior leads that was similar to previous EKG in 2014. EKG was repeated at 2:06 AM showing worsening ST elevation. Dr. Tamala Julian with interventional cardiology was contacted prior to this second EKG and code STEMI was initiated given worsening appearance of second EKG. Patient received aspirin, 4000 units heparin, nitroglycerin in the emergency department. He remained hemodynamically stable and was taken emergently to cath lab upon Dr. Thompson Caul arrival.       Date: 06/08/2014 1:56 AM   Rate: 58  Rhythm: normal sinus rhythm  QRS Axis: normal  Intervals: normal  ST/T Wave abnormalities: ST elevation in anterior and lateral leads, T wave inversions in inferior leads  Conduction Disutrbances: none  Narrative Interpretation: anterolateral STEMI   CRITICAL CARE Performed by: Nyra Jabs   Total critical care time: 35 minutes  Critical care time was exclusive of separately billable procedures and treating other patients.  Critical care was necessary to treat or prevent imminent or life-threatening deterioration.  Critical care was time spent personally by me on the following activities: development of treatment plan with patient and/or surrogate as well as nursing, discussions with consultants, evaluation of patient's response to treatment, examination of patient, obtaining history from patient or surrogate, ordering and performing treatments and interventions, ordering and review of laboratory studies, ordering and review of radiographic studies, pulse oximetry and re-evaluation of patient's condition.   I personally performed the services described in this documentation, which was scribed in my presence. The recorded  information has been reviewed and is accurate.      [place], DO 06/08/14 [phone]

## 2014-06-08 NOTE — Progress Notes (Signed)
eLink Physician-Brief Progress Note Patient Name: COTTON BECKLEY DOB: [birth year] MRN: [id number]   Date of Service  06/08/2014  HPI/Events of Note  44 yo male admitted with Acute Anterolateral STEMI. Taken emergently to Cardiac Cath Lab >> stent placed to proximal LAD. Currently on ASA, Lipator, Metoprolol, Lisinopril and and Brilinta. Management per Cardiology. BP = 105/70 and HR = 78.  eICU Interventions  Continue current management.      Intervention Category Evaluation Type: New Patient Evaluation  Arvie Villarruel [place] 06/08/2014, 4:30 AM

## 2014-06-08 NOTE — Progress Notes (Signed)
Cardiology Progress Note  Subjective: Jeremy Vazquez was take for emergent PCI this early this morning.   He was seen and examined a few hours after the procedure.  He is feeling better, no CP at present but says he still does not feel quite back to normal.  He reports experiencing CP about two hours ago (while at rest) that was not as intense and lasted for about 15 min.  He did not inform the RN and the pain resolved spontaneously.  He denies dyspnea.  Objective: Vital signs in last 24 hours: Filed Vitals:   06/08/14 0600 06/08/14 0615 06/08/14 0630 06/08/14 0645  BP: 114/86 119/83 110/84 98/68  Pulse: 58 67 59 61  Temp:      TempSrc:      Resp: 17 19 19 17   Height:      Weight:      SpO2: 100% 100% 100% 100%   Weight change:   Intake/Output Summary (Last 24 hours) at 06/08/14 0752 Last data filed at 06/08/14 0600  Gross per 24 hour  Intake 374.19 ml  Output    300 ml  Net  74.19 ml   General: sitting up in bed in NAD HEENT: [place]/AT, no JVD or carotid bruits Cardiac: RRR, no rubs, murmurs or gallops; distal pulses intact Pulm: clear to auscultation bilaterally, no wheezes/rales/rhonchi, moving normal volumes of air Abd: soft, nontender, nondistended, BS present Ext: warm and well perfused, no pedal edema, right radial wrist band in place (no bleeding noted), radial pulses 2+ Neuro: alert and oriented X3, responding appropriately, MAE spontaneously  Telemetry:  SB, HR 50s-60s; alarms for PVCs  Lab Results: Basic Metabolic Panel:  Recent Labs Lab 06/08/14 0204  NA 137  K 3.5  CL 102  CO2 23  GLUCOSE 111*  BUN 17  CREATININE 0.83  CALCIUM 9.6   CBC:  Recent Labs Lab 06/08/14 0204 06/08/14 0710  WBC 5.1 5.9  HGB 16.9 15.6  HCT 48.3 44.3  MCV 84.4 83.7  PLT 234 239   Cardiac Enzymes:  Recent Labs Lab 06/08/14 0710  TROPONINI 9.69*   Thyroid Function Tests:  Recent Labs Lab 06/08/14 0710  TSH 0.916   Coagulation:  Recent Labs Lab 06/08/14 0204   LABPROT 14.1  INR 1.07   Studies/Results: Dg Chest Portable 1 View  06/08/2014   CLINICAL DATA:  Chest pain.  EXAM: PORTABLE CHEST - 1 VIEW  COMPARISON:  08/31/2012  FINDINGS: Borderline mild cardiomegaly. Pulmonary vasculature is normal. No consolidation, pleural effusion, or pneumothorax. No acute osseous abnormalities are seen.  IMPRESSION: Borderline mild cardiomegaly without localizing pulmonary process or congestive heart failure.   Electronically Signed   By: Jeb Levering M.D.   On: 06/08/2014 02:46   06/08/14 LHC:  Prox thrombus containing LAD lesion, 99% stenosed. There is a 0% residual stenosis post intervention.  A drug-eluting stent was placed.   Acute coronary syndrome due 99% thrombotic proximal to mid LAD.  Successful DES implantation with reduction in stenosis from 99% to 0% with TIMI grade 3 flow.  Normal circumflex and right coronary arteries  Left ventricular dysfunction, acute systolic, with mid to distal and apical anterior wall akinesis. Estimated ejection fraction 35-40%. I anticipate that this will recover to normal.   RECOMMENDATIONS:  Dual antiplatelet therapy for 12 months  Low-dose beta blocker and ACE inhibitor therapy is started but will be limited by blood pressure initially  High intensity statin therapy  Patient will be fast track and could potentially be discharged  from the hospital between 48 and 72 hours if no complications  Early ambulation this a.m. with transferred to telemetry in 12-18 hours if stable.  Early cardiac rehabilitation  Medications: I have reviewed the patient's current medications. Scheduled Meds: . aspirin  81 mg Oral Daily  . atorvastatin  80 mg Oral q1800  . lisinopril  1.25 mg Oral Daily  . metoprolol succinate  12.5 mg Oral Daily  . sodium chloride  3 mL Intravenous Q12H  . ticagrelor  90 mg Oral BID   Continuous Infusions: . sodium chloride 1 mL/kg/hr (06/08/14 0433)   PRN Meds:.sodium chloride,  acetaminophen, ondansetron (ZOFRAN) IV, oxyCODONE-acetaminophen, sodium chloride   Assessment/Plan: 44 year old with PMH of HTN and HLD presented to ED with 2 day hx of CP and found to have STEMI.  Mixed myocardial ischemia and STEMI:  S/p PCI w/ DES to the LAD now on ASA, Brilinta, high intensity statin, ACEI and BB.  EF 35-40% by ventriculogram but this is expected to recover.  Trop 0.09-->9.69; significant increase likely due to troponin washout s/p PCI.   - continue 81mg  ASA daily, Brilinta, 80mg  Lipitor daily, ACEI and BB; ACEI and BB can be titrated as BP allows - continue to trend troponin (two more expected today) - cardiac rehab - continue to monitor for post-MI complications in CCU; if he continues to do well and troponin stable, can likely transfer to telemetry tomorrow - follow-up with PCP and Cardiology within 1-2 weeks after d/c  Hypertension:  Stable.  Continue BB and ACEI.  He is on amlodipine at home but his BP will likely not tolerate three medications.  Amlodipine has been stopped.    HLD:  Continue 80mg  Lipitor daily.   LOS: 0 days   Francesca Oman, DO IMT[facility] PGY2 on CCU Service 06/08/2014, 7:52 AM

## 2014-06-08 NOTE — H&P (Signed)
Jeremy Vazquez is a 44 y.o. male  Admit Date: 06/08/2014 Referring Physician: [facility] Primary Cardiologist: [facility] Blenda Bridegroom, M.D. Chief complaint / reason for admission: Acute coronary syndrome  HPI: 44 year old gentleman with a 48 hour history of recurring chest discomfort. Episodes of discomfort have lasted up to 10 minutes. Over the past 12-24 hours they have recurred each time he has been physically active. This evening after getting home from work he carried one of his children up the stairs. The chest discomfort recurred and persisted much longer than the prior episodes. He became frightened and EMS was called. In the emergency department he had inferolateral ST segment depression with ST elevation in lead V2.    PMH:    Past Medical History  Diagnosis Date  . Hypertension     PSH:   History reviewed. No pertinent past surgical history. ALLERGIES:   Review of patient's allergies indicates no known allergies. Prior to Admit Meds:   Prescriptions prior to admission  Medication Sig Dispense Refill Last Dose  . amLODipine (NORVASC) 5 MG tablet Take 1 tablet (5 mg total) by mouth daily. 90 tablet 3 06/07/2014 at Unknown time  . simethicone (GAS-X) 80 MG chewable tablet Chew 1 tablet (80 mg total) by mouth every 6 (six) hours as needed for flatulence. 30 tablet 1 06/07/2014 at Unknown time  . calcium & magnesium carbonates (MYLANTA) 311-232 MG per tablet Take 1 tablet by mouth daily. (Patient not taking: Reported on 06/08/2014) 30 tablet 3 Not Taking at Unknown time  . dicyclomine (BENTYL) 10 MG capsule Take 1 capsule (10 mg total) by mouth 4 (four) times daily -  before meals and at bedtime. (Patient not taking: Reported on 06/08/2014) 60 capsule 2 Not Taking at Unknown time  . omeprazole (PRILOSEC) 20 MG capsule Take 1 capsule (20 mg total) by mouth daily. (Patient not taking: Reported on 06/08/2014) 30 capsule 3 Not Taking at Unknown time  . triamcinolone cream  (KENALOG) 0.1 % Apply 1 application topically 2 (two) times daily. (Patient not taking: Reported on 06/08/2014) 30 g 1 Not Taking at Unknown time   Family HX:   No family history on file. Social HX:    History   Social History  . Marital Status: Single    Spouse Name: N/A  . Number of Children: N/A  . Years of Education: N/A   Occupational History  . Not on file.   Social History Main Topics  . Smoking status: Never Smoker   . Smokeless tobacco: Not on file  . Alcohol Use: No  . Drug Use: No  . Sexual Activity: Not on file   Other Topics Concern  . Not on file   Social History Narrative     ROS He has no complaints. 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 Denies diabetes. Never smoked cigarettes. No family history of CAD.  Physical Exam: Blood pressure 105/70, pulse 0, temperature 97.7 F (36.5 C), temperature source Oral, resp. rate 0, height 6' (1.829 m), weight 158 lb (71.668 kg), SpO2 0 %.   Appearance is of Middle-aged, middle Eastern/Northern African gentleman with appearance of anxiety and discomfort. Skin reveals no cyanosis or pallor HEENT exam reveals no jaundice and there is no conjunctival pallor Chest is clear to ask the patient and percussion Neck exam reveals no JVD or carotid bruits Cardiac exam reveals an S4 gallop but otherwise unremarkable Abdomen is soft without tenderness. Bowel sounds are  normal. Extremities reveal no edema. Femoral pulses and radial pulses are 2+. Carotid pulses are 2+ and symmetric. Neurologically intact. No motor sensory deficits are noted. Labs: Lab Results  Component Value Date   WBC 5.1 06/08/2014   HGB 16.9 06/08/2014   HCT 48.3 06/08/2014   MCV 84.4 06/08/2014   PLT 234 06/08/2014    Recent Labs Lab 06/08/14 0204  NA 137  K 3.5  CL 102  CO2 23  BUN 17  CREATININE 0.83  CALCIUM 9.6  GLUCOSE 111*   Radiology:   Chest x-ray: IMPRESSION: Borderline mild cardiomegaly without localizing pulmonary  process or congestive heart failure.  EKG:  Normal sinus rhythm with ST elevation in lead V2, limb lead 1, and aVL with reciprocal changes in 23 and aVF.  ASSESSMENT: 1. Probable anterolateral wall ST elevation myocardial infarction although the patient is pain free at the time of my exam and evaluation 2. History of hypertension  Plan: 1. The story is compatible with acute coronary syndrome and potential asked elevation myocardial infarction although he is totally pain-free at this time. Immediate cardiac catheterization is my recommendation. He has received heparin and aspirin. The procedure and risks have been discussed with the patient in detail including the possibility of stroke, death, myocardial infarction, emergency open heart surgery, bleeding, and arterial insufficiency. The patient is willing to proceed on the emergency protocol.  Sinclair Grooms 06/08/2014 4:05 AM

## 2014-06-08 NOTE — ED Notes (Signed)
Pt. reports intermittent central chest pain for 2 days with mild SOB  and nausea , feels bloated " lots of gas " , denies emesis or diaphoresis .

## 2014-06-08 NOTE — Progress Notes (Signed)
Pt ambulated to bathroom and back to bed with no issues.  VSS. Denies chest pain while ambulating.  R radial cath site level 0 and stable. Will continue to monitor

## 2014-06-08 NOTE — Progress Notes (Signed)
CRITICAL VALUE ALERT  Critical value received:  Trop 9.69  Date of notification:  06/08/2014  Time of notification:  0840  Critical value read back:Yes.    Nurse who received alert:  Bobbe Medico, RN  MD notified (1st page):  Result expected post STEMI.  Will notify rounding physician  Time of first page:  0840  MD notified (2nd page):  Time of second page:  Responding MD:    Time MD responded:

## 2014-06-09 DIAGNOSIS — I214 Non-ST elevation (NSTEMI) myocardial infarction: Secondary | ICD-10-CM

## 2014-06-09 DIAGNOSIS — I259 Chronic ischemic heart disease, unspecified: Secondary | ICD-10-CM

## 2014-06-09 DIAGNOSIS — I2102 ST elevation (STEMI) myocardial infarction involving left anterior descending coronary artery: Principal | ICD-10-CM

## 2014-06-09 LAB — BASIC METABOLIC PANEL
Anion gap: 9 (ref 5–15)
BUN: 13 mg/dL (ref 6–20)
CO2: 30 mmol/L (ref 22–32)
Calcium: 10.1 mg/dL (ref 8.9–10.3)
Chloride: 99 mmol/L — ABNORMAL LOW (ref 101–111)
Creatinine, Ser: 0.83 mg/dL (ref 0.61–1.24)
GFR calc Af Amer: >60 mL/min (ref >60)
GFR calc non Af Amer: >60 mL/min (ref >60)
Glucose, Bld: 78 mg/dL (ref 65–99)
Potassium: 4 mmol/L (ref 3.5–5.1)
Sodium: 138 mmol/L (ref 135–145)

## 2014-06-09 LAB — HEMOGLOBIN A1C
Hgb A1c MFr Bld: 5.8 % — ABNORMAL HIGH (ref 4.8–5.6)
Mean Plasma Glucose: 120 mg/dL

## 2014-06-09 NOTE — Progress Notes (Signed)
CARDIAC REHAB PHASE I   PRE:  Rate/Rhythm: 54 SB  BP:  Sitting: 127/85        SaO2: 100 RA  MODE:  Ambulation: 700 ft   POST:  Rate/Rhythm: 62 SR  BP:  Sitting: 136/84         SaO2: 100 RA  Pt ambulated 700 ft on RA, independent, steady gait, tolerated well.  Pt denies cp, dizziness, DOE, declined rest stop. Completed MI/stent/heart failure education.  Reviewed anti-platelet therapy, stent card, activity restrictions, ntg, exercise, heart healthy diet, portion control, daily weights, sodium and fluid restrictions, phase 2 cardiac rehab. Pt verbalized understanding. Pt agrees to phase 2 cardiac rehab. Will send referral to [facility].  [id number]     Lenna Sciara, RN, BSN 06/09/2014 12:48 PM

## 2014-06-09 NOTE — Progress Notes (Signed)
Cardiology progress Note.    Subjective:  No complaints today, no chest pain overnight, no SOB. Ambulating in hall way.   Objective: Vital signs in last 24 hours: Filed Vitals:   06/09/14 0500 06/09/14 0616 06/09/14 0700 06/09/14 0800  BP: 99/57 95/59 125/72 114/90  Pulse: 52 62 59 64  Temp:    98.6 F (37 C)  TempSrc:    Oral  Resp: 16 15    Height:      Weight:      SpO2: 98% 98% 99% 100%   Weight change:   Intake/Output Summary (Last 24 hours) at 06/09/14 1033 Last data filed at 06/09/14 0800  Gross per 24 hour  Intake    843 ml  Output    800 ml  Net     43 ml   General: was sitting on bed, and then later ambulating, family present. HEENT: [place]/AT,  Cardiac: RRR, no rubs, murmurs or gallops; distal pulses intact Pulm: clear to auscultation bilaterally, no wheezes/rales/rhonchi, moving normal volumes of air Abd: soft, nontender, nondistended, BS present Ext: warm and well perfused, no pedal edema,  radial pulses 2+, clean dressing no tenderness- right radial area Neuro: alert and oriented X3, responding appropriately  Telemetry:  Sinus bradycardia, HR- 60s.   Lab Results: Basic Metabolic Panel:  Recent Labs Lab 06/08/14 0204 06/08/14 0710  NA 137 136  K 3.5 3.9  CL 102 102  CO2 23 26  GLUCOSE 111* 124*  BUN 17 14  CREATININE 0.83 0.77  CALCIUM 9.6 9.0   CBC:  Recent Labs Lab 06/08/14 0204 06/08/14 0710  WBC 5.1 5.9  HGB 16.9 15.6  HCT 48.3 44.3  MCV 84.4 83.7  PLT 234 239   Cardiac Enzymes:  Recent Labs Lab 06/08/14 0710 06/08/14 1055 06/08/14 1531  TROPONINI 9.69* 14.77* 17.64*   Thyroid Function Tests:  Recent Labs Lab 06/08/14 0710  TSH 0.916   Coagulation:  Recent Labs Lab 06/08/14 0204  LABPROT 14.1  INR 1.07   Studies/Results: Dg Chest Portable 1 View  06/08/2014   CLINICAL DATA:  Chest pain.  EXAM: PORTABLE CHEST - 1 VIEW  COMPARISON:  08/31/2012  FINDINGS: Borderline mild cardiomegaly. Pulmonary vasculature is  normal. No consolidation, pleural effusion, or pneumothorax. No acute osseous abnormalities are seen.  IMPRESSION: Borderline mild cardiomegaly without localizing pulmonary process or congestive heart failure.   Electronically Signed   By: Jeb Levering M.D.   On: 06/08/2014 02:46   06/08/14 LHC:  Prox thrombus containing LAD lesion, 99% stenosed. There is a 0% residual stenosis post intervention.  A drug-eluting stent was placed.   Acute coronary syndrome due 99% thrombotic proximal to mid LAD.  Successful DES implantation with reduction in stenosis from 99% to 0% with TIMI grade 3 flow.  Normal circumflex and right coronary arteries  Left ventricular dysfunction, acute systolic, with mid to distal and apical anterior wall akinesis. Estimated ejection fraction 35-40%. I anticipate that this will recover to normal.  Medications: I have reviewed the patient's current medications. Scheduled Meds: . aspirin  81 mg Oral Daily  . atorvastatin  80 mg Oral q1800  . lisinopril  2.5 mg Oral Daily  . metoprolol succinate  25 mg Oral Daily  . sodium chloride  3 mL Intravenous Q12H  . ticagrelor  90 mg Oral BID   Continuous Infusions:   PRN Meds:.sodium chloride, acetaminophen, ondansetron (ZOFRAN) IV, oxyCODONE-acetaminophen, sodium chloride   Assessment/Plan: 44 year old with PMH of HTN and HLD presented  to ED with 2 day hx of CP and found to have STEMI.  Mixed myocardial ischemia and STEMI:  S/p PCI w/ DES to the LAD now on ASA, Brilinta, high intensity statin, ACEI and BB.  EF 35-40% by ventriculogram but this is expected to recover.  Trop elevated to 17.64 yesterday from 0.09 on admission. No chest pain. - continue 81mg  ASA daily, Brilinta, 80mg  Lipitor daily,  - Cont lisinopril- 2.5mg  daily - Cont metoprolol XL 25mg  daily - cardiac rehab - Transfer to tele today, likely d/c tomorrow.  - follow-up with PCP and Cardiology within 1-2 weeks after d/c  Hypertension:  Stable.  Bp-  114/90. Continue BB and ACEI.  Home amlodipine- d/cd - Cont lisinopril and metop.  HLD:  Continue 80mg  Lipitor daily.   LOS: 1 day   Cheral Almas- Resident  PGY2 06/09/2014, 10:33 AM   Patient seen and examined with Dr. Denton Brick. We discussed all aspects of the encounter. I agree with the assessment and plan as stated above.   He is doing very well post PCI. No further CP. Ambulating halls. On good meds. Will transfer to floor. D/c tomorrow.   Lanessa Shill,MD 12:58 PM

## 2014-06-09 NOTE — Progress Notes (Signed)
06/09/2014 1315 Received pt to 2w1 transfer from [place].  Pt without c/o at this time.  Tele monitor placed.Oriented to room, call light and bed.  Call bell in reach, family at bedside.  CTM notified of pt's. Arrival. [place]

## 2014-06-10 ENCOUNTER — Encounter (HOSPITAL_COMMUNITY): Payer: Self-pay | Admitting: Physician Assistant

## 2014-06-10 DIAGNOSIS — I255 Ischemic cardiomyopathy: Secondary | ICD-10-CM

## 2014-06-10 DIAGNOSIS — I10 Essential (primary) hypertension: Secondary | ICD-10-CM

## 2014-06-10 DIAGNOSIS — E785 Hyperlipidemia, unspecified: Secondary | ICD-10-CM

## 2014-06-10 DIAGNOSIS — Z713 Dietary counseling and surveillance: Secondary | ICD-10-CM

## 2014-06-10 DIAGNOSIS — Z719 Counseling, unspecified: Secondary | ICD-10-CM

## 2014-06-10 MED ORDER — ATORVASTATIN CALCIUM 80 MG PO TABS: 80.0000 mg | ORAL_TABLET | Freq: Every evening | ORAL | Status: DC

## 2014-06-10 MED ORDER — NITROGLYCERIN 0.4 MG SL SUBL: 0.4000 mg | SUBLINGUAL_TABLET | SUBLINGUAL | Status: DC | PRN

## 2014-06-10 MED ORDER — METOPROLOL SUCCINATE ER 25 MG PO TB24: 25.0000 mg | ORAL_TABLET | Freq: Every day | ORAL | Status: DC

## 2014-06-10 MED ORDER — LISINOPRIL 2.5 MG PO TABS: 2.5000 mg | ORAL_TABLET | Freq: Every day | ORAL | Status: DC

## 2014-06-10 MED ORDER — ASPIRIN EC 81 MG PO TBEC: 81.0000 mg | DELAYED_RELEASE_TABLET | Freq: Every day | ORAL | Status: DC

## 2014-06-10 MED ORDER — TICAGRELOR 90 MG PO TABS: 90.0000 mg | ORAL_TABLET | Freq: Two times a day (BID) | ORAL | Status: DC

## 2014-06-10 NOTE — Discharge Summary (Signed)
Discharge Summary   Patient ID: LYNDEL SARATE MRN: [id number], DOB/AGE: [birth year] 46 y.o. Admit date: 06/08/2014 D/C date:     06/10/2014  Primary Care Provider: Lorayne Marek, MD Primary Cardiologist: Linard Millers  Primary Discharge Diagnoses:  1. STEMI/CAD s/p DES to LAD 2. Ischemic cardiomyopathy EF 35-40% 2. Essential HTN 3. Dyslipidemia  Hospital Course: Mr. Stumph is a 44 y/o M with history of hypertension who presented to [facility] 06/08/14 with complaints of recurring chest discomfort. He would notice this happen every time he was physically active. On day of admission after getting home from work he carried one of his children up the stairs. The chest discomfort recurred and persisted much longer than the prior episodes. He became frightened and EMS was called. In the emergency department he had inferolateral ST segment depression with ST elevation in lead V2. Emergent cardiac cath was performed showing prox thrombus containing LAD lesion, 99% stenosed which was treated with a drug-eluting stent. He had normal Cx and RCA. LVEF 35-40% with mid to distal and apical anterior wall akinesis which Dr. Tamala Julian expected would recover. Standard post-MI therapy was initiated. Peak troponin 17. Lipids showed trig 204, hdl 35, LDL 100. TSH wnl. Blood pressure and HR have prohibited aggressive medical therapy titration. Not on spironolactone due to low BP. He ambulated well with cardiac rehab. Received 30 day free Brilinta card and regular refills. Cardiac rehab referral was sent per notes. Dr. Bronson Ing has seen and examined the patient today and feels he is stable for discharge. I have sent a message to our [facility] office's scheduler requesting a follow-up appointment, and our office will call the patient with this information. At followup please arrange f/u lipids/LFTs in 6-8 weeks. Will need echocardiogram as outpatient to reassess LVF as it has been suspected there has been improvement.  Work note given - needs to be seen in follow-up before clearing to return.  Discharge Vitals: Blood pressure 100/62, pulse 52, temperature 97.7 F (36.5 C), temperature source Oral, resp. rate 18, height 6' (1.829 m), weight 156 lb 1.4 oz (70.8 kg), SpO2 99 %.  Labs: Lab Results  Component Value Date   WBC 5.9 06/08/2014   HGB 15.6 06/08/2014   HCT 44.3 06/08/2014   MCV 83.7 06/08/2014   PLT 239 06/08/2014    Recent Labs Lab 06/08/14 0710 06/09/14 1159  NA 136 138  K 3.9 4.0  CL 102 99*  CO2 26 30  BUN 14 13  CREATININE 0.77 0.83  CALCIUM 9.0 10.1  PROT 6.7  --   BILITOT 0.5  --   ALKPHOS 51  --   ALT 22  --   AST 58*  --   GLUCOSE 124* 78    Recent Labs  06/08/14 0710 06/08/14 1055 06/08/14 1531  TROPONINI 9.69* 14.77* 17.64*   Lab Results  Component Value Date   CHOL 176 06/08/2014   HDL 35* 06/08/2014   LDLCALC 100* 06/08/2014   TRIG 204* 06/08/2014   No results found for: DDIMER  Diagnostic Studies/Procedures   Dg Chest Portable 1 View  06/08/2014   CLINICAL DATA:  Chest pain.  EXAM: PORTABLE CHEST - 1 VIEW  COMPARISON:  08/31/2012  FINDINGS: Borderline mild cardiomegaly. Pulmonary vasculature is normal. No consolidation, pleural effusion, or pneumothorax. No acute osseous abnormalities are seen.  IMPRESSION: Borderline mild cardiomegaly without localizing pulmonary process or congestive heart failure.   Electronically Signed   By: Jeb Levering M.D.   On: 06/08/2014  02:46   Cardiac catheterization this admission, please see full report and above for summary.  Discharge Medications   Current Discharge Medication List    START taking these medications   Details  aspirin EC 81 MG tablet Take 1 tablet (81 mg total) by mouth daily. Qty: 30 tablet, Refills: 11    atorvastatin (LIPITOR) 80 MG tablet Take 1 tablet (80 mg total) by mouth every evening. Qty: 30 tablet, Refills: 6    lisinopril (PRINIVIL,ZESTRIL) 2.5 MG tablet Take 1 tablet (2.5 mg  total) by mouth daily. Qty: 30 tablet, Refills: 6    metoprolol succinate (TOPROL-XL) 25 MG 24 hr tablet Take 1 tablet (25 mg total) by mouth daily. Qty: 30 tablet, Refills: 6    nitroGLYCERIN (NITROSTAT) 0.4 MG SL tablet Place 1 tablet (0.4 mg total) under the tongue every 5 (five) minutes as needed for chest pain (up to 3 doses). Qty: 25 tablet, Refills: 3    ticagrelor (BRILINTA) 90 MG TABS tablet Take 1 tablet (90 mg total) by mouth 2 (two) times daily. Qty: 60 tablet, Refills: 11      CONTINUE these medications which have NOT CHANGED   Details  simethicone (GAS-X) 80 MG chewable tablet Chew 1 tablet (80 mg total) by mouth every 6 (six) hours as needed for flatulence.    Associated Diagnoses: Bloating symptom      STOP taking these medications     amLODipine (NORVASC) 5 MG tablet      calcium & magnesium carbonates (MYLANTA) 330-076 MG per tablet - patient not taking PTA      dicyclomine (BENTYL) 10 MG capsule - patient not taking PTA      omeprazole (PRILOSEC) 20 MG capsule - patient not taking PTA      triamcinolone cream (KENALOG) 0.1 % - patient not taking PTA         Disposition   The patient will be discharged in stable condition to home. Discharge Instructions    Amb Referral to Cardiac Rehabilitation    Complete by:  As directed   Congestive Heart Failure: If diagnosis is Heart Failure, patient MUST meet each of the CMS criteria: 1. Left Ventricular Ejection Fraction </= 35% 2. NYHA class II-IV symptoms despite being on optimal heart failure therapy for at least 6 weeks. 3. Stable = have not had a recent (<6 weeks) or planned (<6 months) major cardiovascular hospitalization or procedure  Program Details: - Physician supervised classes - 1-3 classes per week over a 12-18 week period, generally for a total of 36 sessions  Physician Certification: I certify that the above Cardiac Rehabilitation treatment is medically necessary and is medically approved by  me for treatment of this patient. The patient is willing and cooperative, able to ambulate and medically stable to participate in exercise rehabilitation. The participant's progress and Individualized Treatment Plan will be reviewed by the Medical Director, Cardiac Rehab staff and as indicated by the Referring/Ordering Physician.  Diagnosis:   Myocardial Infarction PCI       Diet - low sodium heart healthy    Complete by:  As directed      Increase activity slowly    Complete by:  As directed   No driving for 2 weeks. No lifting over 10 lbs for 4 weeks. No sexual activity for 4 weeks. You may not return to work until cleared by your cardiologist. Keep procedure site clean & dry. If you notice increased pain, swelling, bleeding or pus, call/return!  You may shower,  but no soaking baths/hot tubs/pools for 1 week.   One of your heart tests showed weakness of the heart muscle this admission. This may make you more susceptible to weight gain from fluid retention, which can lead to symptoms that we call heart failure. We plan to re-evaluate your heart function with a heart ultrasound in the future. Please follow these special instructions in the meantime.  1. Follow a low-salt diet and watch your fluid intake. In general, you should not be taking in more than 2 liters of fluid per day (no more than 8 glasses per day). Some patients are restricted to less than 1.5 liters of fluid per day (no more than 6 glasses per day). This includes sources of water in foods like soup, coffee, tea, milk, etc. 2. Weigh yourself on the same scale at same time of day and keep a log. 3. Call your doctor: (Anytime you feel any of the following symptoms)  - 3-4 pound weight gain in 1-2 days or 2 pounds overnight  - Shortness of breath, with or without a dry hacking cough  - Swelling in the hands, feet or stomach  - If you have to sleep on extra pillows at night in order to breathe  IT IS IMPORTANT TO LET YOUR DOCTOR KNOW  EARLY ON IF YOU ARE HAVING SYMPTOMS SO WE CAN HELP YOU!  Your amlodipine was stopped. You will go home on a new medicine regimen.          Follow-up Information    Follow up with Sinclair Grooms, MD.   Specialty:  Cardiology   Why:  Office will call you for your followup appointment. Call office if you have not heard back in 3 days.   Contact information:   [address] [address] [phone]         Duration of Discharge Encounter: Greater than 30 minutes including physician and PA time.  Raechel Ache PA-C 06/10/2014, 12:25 PM

## 2014-06-10 NOTE — Progress Notes (Signed)
SUBJECTIVE: Feeling well, denying chest pain, dizziness, palpitations, and shortness of breath. Has several questions regarding a proper cardiac diet as well as exercise. Stands for work, does not do any heavy lifting. Never smoked.     Intake/Output Summary (Last 24 hours) at 06/10/14 1121 Last data filed at 06/10/14 0946  Gross per 24 hour  Intake    480 ml  Output      0 ml  Net    480 ml    Current Facility-Administered Medications  Medication Dose Route Frequency Provider Last Rate Last Dose  . 0.9 %  sodium chloride infusion  250 mL Intravenous PRN Belva Crome, MD      . acetaminophen (TYLENOL) tablet 650 mg  650 mg Oral Q4H PRN Belva Crome, MD      . aspirin chewable tablet 81 mg  81 mg Oral Daily Belva Crome, MD   81 mg at 06/10/14 4665  . atorvastatin (LIPITOR) tablet 80 mg  80 mg Oral q1800 Belva Crome, MD   80 mg at 06/09/14 1743  . lisinopril (PRINIVIL,ZESTRIL) tablet 2.5 mg  2.5 mg Oral Daily Belva Crome, MD   2.5 mg at 06/10/14 0942  . metoprolol succinate (TOPROL-XL) 24 hr tablet 25 mg  25 mg Oral Daily Belva Crome, MD   25 mg at 06/10/14 [phone]  . ondansetron (ZOFRAN) injection 4 mg  4 mg Intravenous Q6H PRN Belva Crome, MD      . oxyCODONE-acetaminophen (PERCOCET/ROXICET) 5-325 MG per tablet 1-2 tablet  1-2 tablet Oral Q4H PRN Belva Crome, MD      . sodium chloride 0.9 % injection 3 mL  3 mL Intravenous Q12H Belva Crome, MD   3 mL at 06/10/14 [phone]  . sodium chloride 0.9 % injection 3 mL  3 mL Intravenous PRN Belva Crome, MD      . ticagrelor [facility]) tablet 90 mg  90 mg Oral BID Belva Crome, MD   90 mg at 06/10/14 0942    Filed Vitals:   06/09/14 1242 06/09/14 1312 06/09/14 1935 06/10/14 0436  BP:  121/88 112/71 100/62  Pulse: 55 58 58 52  Temp: 97.5 F (36.4 C) 97 F (36.1 C) 97.7 F (36.5 C) 97.7 F (36.5 C)  TempSrc: Oral Oral Oral Oral  Resp:   18 18  Height:      Weight:      SpO2: 99% 100% 98% 99%    PHYSICAL  EXAM General: NAD HEENT: Normal. Neck: No JVD, no thyromegaly.  Lungs: Clear to auscultation bilaterally with normal respiratory effort. CV: Nondisplaced PMI.  Regular rate and rhythm, normal S1/S2, no S3/S4, no murmur.  No pretibial edema.  No carotid bruit.  Normal pedal pulses.  Abdomen: Soft, nontender, no hepatosplenomegaly, no distention.  Neurologic: Alert and oriented x 3.  Psych: Normal affect. Musculoskeletal: Normal range of motion. No gross deformities. Extremities: No clubbing or cyanosis.   TELEMETRY: Reviewed telemetry pt in sinus bradycardia.  LABS: Basic Metabolic Panel:  Recent Labs  06/08/14 0710 06/09/14 1159  NA 136 138  K 3.9 4.0  CL 102 99*  CO2 26 30  GLUCOSE 124* 78  BUN 14 13  CREATININE 0.77 0.83  CALCIUM 9.0 10.1   Liver Function Tests:  Recent Labs  06/08/14 0710  AST 58*  ALT 22  ALKPHOS 51  BILITOT 0.5  PROT 6.7  ALBUMIN 3.6   No results for  input(s): LIPASE, AMYLASE in the last 72 hours. CBC:  Recent Labs  06/08/14 0204 06/08/14 0710  WBC 5.1 5.9  HGB 16.9 15.6  HCT 48.3 44.3  MCV 84.4 83.7  PLT 234 239   Cardiac Enzymes:  Recent Labs  06/08/14 0710 06/08/14 1055 06/08/14 1531  TROPONINI 9.69* 14.77* 17.64*   BNP: Invalid input(s): POCBNP D-Dimer: No results for input(s): DDIMER in the last 72 hours. Hemoglobin A1C:  Recent Labs  06/08/14 0710  HGBA1C 5.8*   Fasting Lipid Panel:  Recent Labs  06/08/14 1328  CHOL 176  HDL 35*  LDLCALC 100*  TRIG 204*  CHOLHDL 5.0   Thyroid Function Tests:  Recent Labs  06/08/14 0710  TSH 0.916   Anemia Panel: No results for input(s): VITAMINB12, FOLATE, FERRITIN, TIBC, IRON, RETICCTPCT in the last 72 hours.  RADIOLOGY: Dg Chest Portable 1 View  06/08/2014   CLINICAL DATA:  Chest pain.  EXAM: PORTABLE CHEST - 1 VIEW  COMPARISON:  08/31/2012  FINDINGS: Borderline mild cardiomegaly. Pulmonary vasculature is normal. No consolidation, pleural effusion, or  pneumothorax. No acute osseous abnormalities are seen.  IMPRESSION: Borderline mild cardiomegaly without localizing pulmonary process or congestive heart failure.   Electronically Signed   By: Jeb Levering M.D.   On: 06/08/2014 02:46      ASSESSMENT AND PLAN: 1. STEMI s/p DES to LAD, EF 35-40%: Symptomatically stable. Continue dual antiplatelet therapy with ASA and Brilinta for minimum of 1 year. Continue Lipitor, lisinopril, and metoprolol indefinitely. Gave extensive education on importance of diet low in saturated fats (likes to eat lots of lamb) and high in vegetables, whole grains, and fruit. Should start cardiac rehabilitation as outpatient.  2. Essential HTN: BP low normal. Continue current therapy as pt is asymptomatic. On very-low dose lisinopril.  3. Hyperlipidemia: On high intensity statin therapy.  Dispo: D/c to home today.   Kate Sable, M.D., F.A.C.C.

## 2014-06-10 NOTE — Progress Notes (Signed)
Pt and family given d/c instructions; IV's and tele monitor d/c at this time; all verbalized understanding of d/c instructions; prescriptions given; pt to d/c home with family; will cont. To monitor.

## 2014-06-11 MED FILL — Lidocaine HCl Local Preservative Free (PF) Inj 1%: INTRAMUSCULAR | Qty: 30 | Status: AC

## 2014-06-11 MED FILL — Heparin Sodium (Porcine) 2 Unit/ML in Sodium Chloride 0.9%: INTRAMUSCULAR | Qty: 2000 | Status: AC

## 2014-06-13 ENCOUNTER — Telehealth: Payer: Self-pay | Admitting: Interventional Cardiology

## 2014-06-13 NOTE — Telephone Encounter (Signed)
Returned pt call. Adv pt  per Marcina Millard on his [facility] hosp d/c summary. needs to be seen in follow-up before clearing to return to work. Pt verbalized understanding Pt reports that his is doing well, he is asymptomatic, taking his meds as prescribed, and walking 10-15 min day. Adv pt to keep his TOC appt with Audry Riles on 6/1

## 2014-06-13 NOTE — Telephone Encounter (Signed)
New Message  Pt was recently discharged from the hospital made 7 day TOC appt. Pt req a call back to determine or discuss if he can return to work in the mean time. Please call

## 2014-06-20 ENCOUNTER — Encounter: Payer: Self-pay | Admitting: Physician Assistant

## 2014-06-20 ENCOUNTER — Ambulatory Visit (INDEPENDENT_AMBULATORY_CARE_PROVIDER_SITE_OTHER): Payer: BLUE CROSS/BLUE SHIELD | Admitting: Physician Assistant

## 2014-06-20 ENCOUNTER — Encounter: Payer: Self-pay | Admitting: *Deleted

## 2014-06-20 VITALS — BP 104/70 | HR 51 | Ht 72.0 in | Wt 153.6 lb

## 2014-06-20 DIAGNOSIS — I255 Ischemic cardiomyopathy: Secondary | ICD-10-CM | POA: Diagnosis not present

## 2014-06-20 DIAGNOSIS — E785 Hyperlipidemia, unspecified: Secondary | ICD-10-CM | POA: Diagnosis not present

## 2014-06-20 DIAGNOSIS — I509 Heart failure, unspecified: Secondary | ICD-10-CM | POA: Diagnosis not present

## 2014-06-20 DIAGNOSIS — I10 Essential (primary) hypertension: Secondary | ICD-10-CM

## 2014-06-20 DIAGNOSIS — I259 Chronic ischemic heart disease, unspecified: Secondary | ICD-10-CM

## 2014-06-20 DIAGNOSIS — I2102 ST elevation (STEMI) myocardial infarction involving left anterior descending coronary artery: Secondary | ICD-10-CM

## 2014-06-20 NOTE — Assessment & Plan Note (Signed)
Recheck echocardiogram in 2 months and then follow-up with Dr. Tamala Julian. Patient appears euvolemic.

## 2014-06-20 NOTE — Assessment & Plan Note (Signed)
Check LFTs and lipid panel in 8 weeks.

## 2014-06-20 NOTE — Assessment & Plan Note (Signed)
Blood pressures well-controlled and actually a little bit on the lower side. He does complain of some intermittent lightheadedness.  If this persists we may need to back off on either his metoprolol and/or lisinopril.

## 2014-06-20 NOTE — Progress Notes (Signed)
Patient ID: Jeremy Vazquez, male   DOB: 04/19/1970, 44 y.o.   MRN: [id number]    Date:  06/20/2014   ID:  Jeremy Vazquez, DOB 1970/05/10, MRN [id number]  PCP:  Lorayne Marek, MD  Primary Cardiologist:  Tamala Julian  Chief complaint: Posthospital follow-up   History of Present Illness: Jeremy Vazquez is a 44 y.o. male with history of hypertension who presented to [facility] 06/08/14 with complaints of recurring chest discomfort.  In the emergency department he had inferolateral ST segment depression with ST elevation in lead V2. Emergent cardiac cath was performed showing prox thrombus containing LAD lesion, 99% stenosed which was treated with a drug-eluting stent. He had normal Cx and RCA. LVEF 35-40% with mid to distal and apical anterior wall akinesis which Dr. Tamala Julian expected would recover.  Lipids showed trig 204, hdl 35, LDL 100. TSH wnl. Blood pressure and HR prohibited aggressive medical therapy titration and not on spironolactone for the same reason.    Patient presents for posthospital follow-up. He reports doing quite well. He does feel tired but is able to go out and walk every morning. Has not missed any doses of Brilinta. He denies any shortness of breath orthopnea, edema or PND.  He occasionally has a dry cough and feels a little dizziness at times.  The patient currently denies nausea, vomiting, fever, chest pain, congestion, abdominal pain, hematochezia, melena, claudication.  Wt Readings from Last 3 Encounters:  06/20/14 153 lb 9.6 oz (69.673 kg)  06/08/14 156 lb 1.4 oz (70.8 kg)  09/26/13 150 lb 3.2 oz (68.13 kg)     Past Medical History  Diagnosis Date  . Hypertension   . CAD (coronary artery disease)     a. STEMI 05/2014 - s/p DES to LAD.  . Ischemic cardiomyopathy     a. EF 35-40% by cath at time of STEMI 05/2014.  Marland Kitchen Dyslipidemia     Current Outpatient Prescriptions  Medication Sig Dispense Refill  . aspirin EC 81 MG tablet Take 1 tablet (81 mg total) by  mouth daily. 30 tablet 11  . atorvastatin (LIPITOR) 80 MG tablet Take 1 tablet (80 mg total) by mouth every evening. 30 tablet 6  . lisinopril (PRINIVIL,ZESTRIL) 2.5 MG tablet Take 1 tablet (2.5 mg total) by mouth daily. 30 tablet 6  . metoprolol succinate (TOPROL-XL) 25 MG 24 hr tablet Take 1 tablet (25 mg total) by mouth daily. 30 tablet 6  . nitroGLYCERIN (NITROSTAT) 0.4 MG SL tablet Place 1 tablet (0.4 mg total) under the tongue every 5 (five) minutes as needed for chest pain (up to 3 doses). 25 tablet 3  . simethicone (GAS-X) 80 MG chewable tablet Chew 1 tablet (80 mg total) by mouth every 6 (six) hours as needed for flatulence. 30 tablet 1  . ticagrelor (BRILINTA) 90 MG TABS tablet Take 1 tablet (90 mg total) by mouth 2 (two) times daily. 60 tablet 11   No current facility-administered medications for this visit.    Allergies:   No Known Allergies  Social History:  The patient  reports that he has never smoked. He has never used smokeless tobacco. He reports that he does not drink alcohol or use illicit drugs.   Family history:   Family History  Problem Relation Age of Onset  . Diabetes type II Mother   . Other Father     STOMACH ISSUES  . Diabetes type II Sister   . Diabetes type II Brother   . Hypertension Brother   .  Diabetes type II Brother     ROS:  Please see the history of present illness.  All other systems reviewed and negative.   PHYSICAL EXAM: VS:  BP 104/70 mmHg  Pulse 51  Ht 6' (1.829 m)  Wt 153 lb 9.6 oz (69.673 kg)  BMI 20.83 kg/m2 Well nourished, well developed, in no acute distress HEENT: Pupils are equal round react to light accommodation extraocular movements are intact.  Neck: no JVDNo cervical lymphadenopathy. Cardiac: Regular rhythm rate slow without murmurs rubs or gallops. Lungs:  clear to auscultation bilaterally, no wheezing, rhonchi or rales Abd: soft, nontender, positive bowel sounds all quadrants, no hepatosplenomegaly Ext: no lower  extremity edema.  2+ radial and dorsalis pedis pulses. Skin: warm and dryRadial cath site is healing well. Neuro:  Grossly normal  EKG: Sinus bradycardia. Lateral T-wave inversions. 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 Is taking his Brilinta and aspirin as prescribed. Referral to Phase II cardiac rehabilitation has been made.  He will likely participative as insurance will current.      Ischemic cardiomyopathy    Recheck echocardiogram in 2 months and then follow-up with Dr. Tamala Julian. Patient appears euvolemic.      Relevant Orders   EKG 12-Lead   Hyperlipidemia    Check LFTs and lipid panel in 8 weeks.      Relevant Orders   Hepatic function panel   Lipid Profile   Essential hypertension, benign - Primary    Blood pressures well-controlled and actually a little bit on the lower side. He does complain of some intermittent lightheadedness.  If this persists we may need to back off on either his metoprolol and/or lisinopril.      Relevant Orders   EKG 12-Lead    Other Visit Diagnoses    Chronic congestive heart failure, unspecified congestive heart failure type        Relevant Orders    Echocardiogram

## 2014-06-20 NOTE — Patient Instructions (Addendum)
Medication Instructions:    Labwork:  LIPIDS  and LFT AND in 8 weeks   Testing/Procedures:  Your physician has requested that you have an echocardiogram.  In 8 weeks   Echocardiography is a painless test that uses sound waves to create images of your heart. It provides your doctor with information about the size and shape of your heart and how well your heart's chambers and valves are working. This procedure takes approximately one hour. There are no restrictions for this procedure.    Follow-Up:  WITH DR Tamala Julian IN 3 MONTHS   Any Other Special Instructions Will Be Listed Below (If Applicable).

## 2014-06-20 NOTE — Assessment & Plan Note (Signed)
No complaints of angina. Is taking his Brilinta and aspirin as prescribed. Referral to Phase II cardiac rehabilitation has been made.  He will likely participative as insurance will current.

## 2014-07-10 ENCOUNTER — Telehealth: Payer: Self-pay | Admitting: Interventional Cardiology

## 2014-07-10 NOTE — Telephone Encounter (Signed)
Patient came in with money order [phone] in the amount of $25.00 alone with his release form sent to [facility].  07/10/14 ltd

## 2014-07-31 ENCOUNTER — Telehealth: Payer: Self-pay | Admitting: Physician Assistant

## 2014-07-31 NOTE — Telephone Encounter (Signed)
Matt Holmes, HR with pt's employer called regarding return to work notice.  Per note, she has two conflicting sets of information.  1. A letter dated 06/20/14 stating return to work w/ no limitations. This is viewable in electronic record 2. A form filled out for  which had some kind of length of time w/ limitation of activity. (No lifting over 25 lbs). Apparently this was dated for July 5th but I cannot locate.  She is not sure which is accurate.   If 2nd form (which I cannot locate in EMR) is upheld, means pt at this time cannot work bc they cannot alter work environment to allow for less than 25 lbs lifting.  Since I cannot locate 2nd form, requested copy of this to be faxed to our office. Will review & see if warrants elevation for attention by provider.

## 2014-07-31 NOTE — Telephone Encounter (Signed)
Was able to locate HIM ROI Photographer) in media, matches faxed report sent by Matt Holmes at patient's employer HR. This is an unsigned version - No signed copy on file. Matt Holmes sent Public relations account executive w/ signature. Advised that Cert of Healthcare Provider stands as more recent copy, defer to this for decision on pt's out-of-work/FMLA. She voiced understanding.  Copy of faxed form given to [facility] in Medical Records for Careers information officer.

## 2014-08-15 ENCOUNTER — Ambulatory Visit (HOSPITAL_COMMUNITY): Payer: BLUE CROSS/BLUE SHIELD | Attending: Cardiology

## 2014-08-15 ENCOUNTER — Other Ambulatory Visit: Payer: Self-pay

## 2014-08-15 ENCOUNTER — Other Ambulatory Visit (INDEPENDENT_AMBULATORY_CARE_PROVIDER_SITE_OTHER): Payer: BLUE CROSS/BLUE SHIELD | Admitting: *Deleted

## 2014-08-15 DIAGNOSIS — E785 Hyperlipidemia, unspecified: Secondary | ICD-10-CM | POA: Insufficient documentation

## 2014-08-15 DIAGNOSIS — I509 Heart failure, unspecified: Secondary | ICD-10-CM

## 2014-08-15 DIAGNOSIS — I252 Old myocardial infarction: Secondary | ICD-10-CM | POA: Diagnosis not present

## 2014-08-15 DIAGNOSIS — I255 Ischemic cardiomyopathy: Secondary | ICD-10-CM | POA: Insufficient documentation

## 2014-08-15 DIAGNOSIS — I10 Essential (primary) hypertension: Secondary | ICD-10-CM | POA: Insufficient documentation

## 2014-08-15 LAB — HEPATIC FUNCTION PANEL
ALT: 33 U/L (ref 0–53)
AST: 24 U/L (ref 0–37)
Albumin: 4.5 g/dL (ref 3.5–5.2)
Alkaline Phosphatase: 66 U/L (ref 39–117)
Bilirubin, Direct: 0.2 mg/dL (ref 0.0–0.3)
Total Bilirubin: 0.5 mg/dL (ref 0.2–1.2)
Total Protein: 7.5 g/dL (ref 6.0–8.3)

## 2014-08-15 LAB — LIPID PANEL
Cholesterol: 98 mg/dL (ref 0–200)
HDL: 28.1 mg/dL — ABNORMAL LOW (ref >39.00)
LDL Cholesterol: 43 mg/dL (ref 0–99)
NonHDL: 69.9
Total CHOL/HDL Ratio: 3
Triglycerides: 136 mg/dL (ref 0.0–149.0)
VLDL: 27.2 mg/dL (ref 0.0–40.0)

## 2014-08-15 NOTE — Addendum Note (Signed)
Addended by: Eulis Foster on: 08/15/2014 08:56 AM   Modules accepted: Orders

## 2014-08-16 ENCOUNTER — Telehealth: Payer: Self-pay | Admitting: Interventional Cardiology

## 2014-08-16 NOTE — Telephone Encounter (Signed)
Follow Up ° ° ° °Pt is returning call from earlier regarding test results. Please call. °

## 2014-08-16 NOTE — Telephone Encounter (Signed)
Returned patient's call about his labs and echo results. Per Tarri Fuller PA, let patient know labs are good and echo shows his ejection fraction has improved to normal range. Patient verbalized understanding and was grateful for the call.

## 2014-09-26 DIAGNOSIS — I2510 Atherosclerotic heart disease of native coronary artery without angina pectoris: Secondary | ICD-10-CM | POA: Insufficient documentation

## 2014-09-26 DIAGNOSIS — I5032 Chronic diastolic (congestive) heart failure: Secondary | ICD-10-CM

## 2014-09-26 HISTORY — DX: Atherosclerotic heart disease of native coronary artery without angina pectoris: I25.10

## 2014-09-26 HISTORY — DX: Chronic diastolic (congestive) heart failure: I50.32

## 2014-09-26 NOTE — Progress Notes (Signed)
Cardiology Office Note   Date:  09/27/2014   ID:  [name], DOB [birth year], MRN [id number]  PCP:  Lorayne Marek, MD  Cardiologist:  Sinclair Grooms, MD   Chief Complaint  Patient presents with  . Coronary Artery Disease      History of Present Illness: Jeremy Vazquez is a 44 y.o. male who presents for CAD with recent history of LAD STEMI (DES mid vessel), transient acute systolic heart failure resolved to diastolic heart failure with EF greater than 50%, essential hypertension, and hyperlipidemia.  Accompanied by his son. Doing well. Back it work 2 weeks after the anterior wall MI. EF improved from 35% to 55% post recovery. He denies orthopnea and PND. No peripheral edema. No medication side effects. Blood pressure is recorded frequently at home had routinely been under 130 mmHg.  He has had some exertion-related chest discomfort. Location is midsternal. Usually occurs after vigorous physical activity. It does not always occur even with high levels of aerobic activity. No dyspnea associated. Goes away in less than 2-3 minutes. Has not changed. He has been dosing this discomfort since he resume work 2 weeks after his MI.  Past Medical History  Diagnosis Date  . Hypertension   . CAD (coronary artery disease)     a. STEMI 05/2014 - s/p DES to LAD.  . Ischemic cardiomyopathy     a. EF 35-40% by cath at time of STEMI 05/2014.  Marland Kitchen Dyslipidemia     Past Surgical History  Procedure Laterality Date  . Cardiac catheterization N/A 06/08/2014    Procedure: Left Heart Cath and Coronary Angiography;  Surgeon: Belva Crome, MD;  Location: [facility];  Service: Cardiovascular;  Laterality: N/A;  . Cardiac catheterization N/A 06/08/2014    Procedure: Coronary Stent Intervention;  Surgeon: Belva Crome, MD;  Location: [facility];  Service: Cardiovascular;  Laterality: N/A;     Current Outpatient Prescriptions  Medication Sig Dispense Refill  . aspirin EC 81 MG  tablet Take 1 tablet (81 mg total) by mouth daily. 30 tablet 11  . atorvastatin (LIPITOR) 80 MG tablet Take 1 tablet (80 mg total) by mouth every evening. 30 tablet 6  . lisinopril (PRINIVIL,ZESTRIL) 2.5 MG tablet Take 1 tablet (2.5 mg total) by mouth daily. 30 tablet 6  . metoprolol succinate (TOPROL-XL) 25 MG 24 hr tablet Take 1 tablet (25 mg total) by mouth daily. 30 tablet 6  . nitroGLYCERIN (NITROSTAT) 0.4 MG SL tablet Place 1 tablet (0.4 mg total) under the tongue every 5 (five) minutes as needed for chest pain (up to 3 doses). 25 tablet 3  . simethicone (MYLICON) 80 MG chewable tablet Chew 80 mg by mouth every 6 (six) hours as needed for flatulence.    . ticagrelor (BRILINTA) 90 MG TABS tablet Take 1 tablet (90 mg total) by mouth 2 (two) times daily. 60 tablet 11   No current facility-administered medications for this visit.    Allergies:   Review of patient's allergies indicates no known allergies.    Social History:  The patient  reports that he has never smoked. He has never used smokeless tobacco. He reports that he does not drink alcohol or use illicit drugs.   Family History:  The patient's family history includes Diabetes type II in his brother, brother, mother, and sister; Hypertension in his brother; Other in his father.    ROS:  Please see the history of present illness.   Otherwise,  review of systems are positive for feel short of breath occasionally, low back discomfort, and worried about bradycardia. I explained that he is on a beta blocker..   All other systems are reviewed and negative.    PHYSICAL EXAM: VS:  BP 138/84 mmHg  Pulse 59  Ht 6' (1.829 m)  Wt 70.58 kg (155 lb 9.6 oz)  BMI 21.10 kg/m2  SpO2 99% , BMI Body mass index is 21.1 kg/(m^2). GEN: Well nourished, well developed, in no acute distress HEENT: normal Neck: no JVD, carotid bruits, or masses Cardiac: RRR.  There is  no murmur, rub, or gallop. There is no edema. Respiratory:  clear to auscultation  bilaterally, normal work of breathing. GI: soft, nontender, nondistended, + BS MS: no deformity or atrophy Skin: warm and dry, no rash Neuro:  Strength and sensation are intact Psych: euthymic mood, full affect   EKG:  EKG is not ordered today.    Recent Labs: 06/08/2014: B Natriuretic Peptide 45.1; Hemoglobin 15.6; Platelets 239; TSH 0.916 06/09/2014: BUN 13; Creatinine, Ser 0.83; Potassium 4.0; Sodium 138 08/15/2014: ALT 33    Lipid Panel    Component Value Date/Time   CHOL 98 08/15/2014 0856   TRIG 136.0 08/15/2014 0856   HDL 28.10* 08/15/2014 0856   CHOLHDL 3 08/15/2014 0856   VLDL 27.2 08/15/2014 0856   LDLCALC 43 08/15/2014 0856      Wt Readings from Last 3 Encounters:  09/27/14 70.58 kg (155 lb 9.6 oz)  06/20/14 69.673 kg (153 lb 9.6 oz)  06/08/14 70.8 kg (156 lb 1.4 oz)      Other studies Reviewed: Additional studies/ records that were reviewed today include: Reviewed lipid panel done earlier in the year.. The findings include total cholesterol is less than 100 LDL is in the 40s..    ASSESSMENT AND PLAN:  1. 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  2. Chronic diastolic heart failure No evidence of volume overload  3. Essential hypertension, benign Borderline blood pressures today, perhaps with some whitecoat phenomenon.  4. Hyperlipidemia Marked suppression of total and LDL cholesterol. We will decrease the intensity of lipid therapy.  5. Coronary artery disease involving native coronary artery of native heart without angina pectoris Possibly having some angina. Nitroglycerin has not been needed.   Current medicines are reviewed at length with the patient today.  The patient has the following concerns regarding medicines: Bradycardia and whether medications are involved. We are long discussion concerning the indication for all of his therapies..  The following changes/actions have been  instituted:    Decreased atorvastatin 40 mg per day  Lipid panel in 6 months.  Monitor exertional chest discomfort for progression, and call if symptoms worsen.  Labs/ tests ordered today include:  No orders of the defined types were placed in this encounter.     Disposition:   FU with HS in 6 months  Signed, Sinclair Grooms, MD  09/27/2014 9:07 AM    [facility] [address], [place], [place]  [address] Phone: [phone]; Fax: [phone]

## 2014-09-27 ENCOUNTER — Ambulatory Visit (INDEPENDENT_AMBULATORY_CARE_PROVIDER_SITE_OTHER): Payer: BLUE CROSS/BLUE SHIELD | Admitting: Interventional Cardiology

## 2014-09-27 ENCOUNTER — Encounter: Payer: Self-pay | Admitting: Interventional Cardiology

## 2014-09-27 VITALS — BP 138/84 | HR 59 | Ht 72.0 in | Wt 155.6 lb

## 2014-09-27 DIAGNOSIS — I2510 Atherosclerotic heart disease of native coronary artery without angina pectoris: Secondary | ICD-10-CM

## 2014-09-27 DIAGNOSIS — I252 Old myocardial infarction: Secondary | ICD-10-CM | POA: Diagnosis not present

## 2014-09-27 DIAGNOSIS — I10 Essential (primary) hypertension: Secondary | ICD-10-CM

## 2014-09-27 DIAGNOSIS — I5032 Chronic diastolic (congestive) heart failure: Secondary | ICD-10-CM

## 2014-09-27 DIAGNOSIS — E785 Hyperlipidemia, unspecified: Secondary | ICD-10-CM

## 2014-09-27 MED ORDER — ATORVASTATIN CALCIUM 40 MG PO TABS: 40.0000 mg | ORAL_TABLET | Freq: Every evening | ORAL | Status: DC

## 2014-09-27 NOTE — Patient Instructions (Signed)
Medication Instructions:  Your physician has recommended you make the following change in your medication:  DECREASE Atorvastatin to 40mg  daily   Labwork: None orderd  Testing/Procedures: None ordered  Follow-Up: Your physician wants you to follow-up in: 6 months with Dr.Smith You will receive a reminder letter in the mail two months in advance. If you don't receive a letter, please call our office to schedule the follow-up appointment.   Any Other Special Instructions Will Be Listed Below (If Applicable). Call the office if you are having increased chest discomfort occuring more frequently and lasting longer in duration

## 2014-10-08 ENCOUNTER — Telehealth: Payer: Self-pay

## 2014-10-08 NOTE — Telephone Encounter (Signed)
Patient called because pharmacy said his Brilinta needed a refill authorization. I called [facility] and asked why since a year supply was given to patient from hospital stay and pharmacist didn't know and tried to run it again and said it went though this time because someone had put in the wrong code and when she took it out the refill went through.

## 2014-10-08 NOTE — Telephone Encounter (Signed)
PA done online for Brilinta, although [facility] messaged back: NO PA REQUIRED. Pharmacy notified.

## 2014-11-18 ENCOUNTER — Encounter (HOSPITAL_COMMUNITY): Payer: Self-pay | Admitting: *Deleted

## 2014-11-18 ENCOUNTER — Emergency Department (HOSPITAL_COMMUNITY)
Admission: EM | Admit: 2014-11-18 | Discharge: 2014-11-18 | Disposition: A | Discharge status: Home / Self Care | Payer: BLUE CROSS/BLUE SHIELD | Attending: Emergency Medicine | Admitting: Emergency Medicine

## 2014-11-18 DIAGNOSIS — I10 Essential (primary) hypertension: Secondary | ICD-10-CM | POA: Diagnosis present

## 2014-11-18 DIAGNOSIS — E785 Hyperlipidemia, unspecified: Secondary | ICD-10-CM | POA: Insufficient documentation

## 2014-11-18 DIAGNOSIS — I2510 Atherosclerotic heart disease of native coronary artery without angina pectoris: Secondary | ICD-10-CM | POA: Diagnosis not present

## 2014-11-18 DIAGNOSIS — I159 Secondary hypertension, unspecified: Secondary | ICD-10-CM

## 2014-11-18 DIAGNOSIS — Z79899 Other long term (current) drug therapy: Secondary | ICD-10-CM | POA: Diagnosis not present

## 2014-11-18 DIAGNOSIS — Z7982 Long term (current) use of aspirin: Secondary | ICD-10-CM | POA: Diagnosis not present

## 2014-11-18 NOTE — ED Provider Notes (Signed)
CSN: [id number]     Arrival date & time 11/18/14  1747 History   First MD Initiated Contact with Patient 11/18/14 1812     Chief Complaint  Patient presents with  . Hypertension     (Consider location/radiation/quality/duration/timing/severity/associated sxs/prior Treatment) HPI Jeremy Vazquez is a 44 y.o. male with hx of HTN, CAD, hx of MI, presents to ED with complaint of elevated blood pressure. Pt states his BP normally under 409 systolic. States checked it yesterday at home and it was 141/80. States went to [facility] today to make sure his BP monitor was correct and it was 159/84. Pt states he became concerned bc he has never had his BP this high and was sent here. Pt denies any associated symptoms at this time. He does admit however that once in a while he will have some chest tightness, worse on exertion, but states that he has had this since his MI 6 months ago and has told this to cardiologist who instructed him to take nitro if it does not go away. Pt denies any CP at this time. He admits to some allergy symptoms. No other complaints.   Past Medical History  Diagnosis Date  . Hypertension   . CAD (coronary artery disease)     a. STEMI 05/2014 - s/p DES to LAD.  . Ischemic cardiomyopathy     a. EF 35-40% by cath at time of STEMI 05/2014.  Marland Kitchen Dyslipidemia    Past Surgical History  Procedure Laterality Date  . Cardiac catheterization N/A 06/08/2014    Procedure: Left Heart Cath and Coronary Angiography;  Surgeon: Belva Crome, MD;  Location: [facility];  Service: Cardiovascular;  Laterality: N/A;  . Cardiac catheterization N/A 06/08/2014    Procedure: Coronary Stent Intervention;  Surgeon: Belva Crome, MD;  Location: [facility];  Service: Cardiovascular;  Laterality: N/A;   Family History  Problem Relation Age of Onset  . Diabetes type II Mother   . Other Father     STOMACH ISSUES  . Diabetes type II Sister   . Diabetes type II Brother   . Hypertension  Brother   . Diabetes type II Brother    Social History  Substance Use Topics  . Smoking status: Never Smoker   . Smokeless tobacco: Never Used  . Alcohol Use: No    Review of Systems  Constitutional: Negative for fever and chills.  Respiratory: Negative for cough, chest tightness and shortness of breath.   Cardiovascular: Negative for chest pain, palpitations and leg swelling.  Gastrointestinal: Negative for nausea, vomiting, abdominal pain, diarrhea and abdominal distention.  Genitourinary: Negative for urgency.  Musculoskeletal: Negative for myalgias, arthralgias, neck pain and neck stiffness.  Skin: Negative for rash.  Allergic/Immunologic: Negative for immunocompromised state.  Neurological: Negative for dizziness, weakness, light-headedness, numbness and headaches.      Allergies  Review of patient's allergies indicates no known allergies.  Home Medications   Prior to Admission medications   Medication Sig Start Date End Date Taking? Authorizing Provider  aspirin EC 81 MG tablet Take 1 tablet (81 mg total) by mouth daily. 06/10/14   Dayna N Dunn, PA-C  atorvastatin (LIPITOR) 40 MG tablet Take 1 tablet (40 mg total) by mouth every evening. 09/27/14   Belva Crome, MD  lisinopril (PRINIVIL,ZESTRIL) 2.5 MG tablet Take 1 tablet (2.5 mg total) by mouth daily. 06/10/14   Dayna N Dunn, PA-C  metoprolol succinate (TOPROL-XL) 25 MG 24 hr tablet Take 1  tablet (25 mg total) by mouth daily. 06/10/14   Dayna N Dunn, PA-C  nitroGLYCERIN (NITROSTAT) 0.4 MG SL tablet Place 1 tablet (0.4 mg total) under the tongue every 5 (five) minutes as needed for chest pain (up to 3 doses). 06/10/14   Dayna N Dunn, PA-C  simethicone (MYLICON) 80 MG chewable tablet Chew 80 mg by mouth every 6 (six) hours as needed for flatulence.    Historical Provider, MD  ticagrelor (BRILINTA) 90 MG TABS tablet Take 1 tablet (90 mg total) by mouth 2 (two) times daily. 06/10/14   Dayna N Dunn, PA-C   BP 151/87 mmHg  Pulse  56  Temp(Src) 98.3 F (36.8 C) (Oral)  Resp 18  SpO2 99% Physical Exam  Constitutional: He is oriented to person, place, and time. He appears well-developed and well-nourished. No distress.  HENT:  Head: Normocephalic and atraumatic.  Eyes: Conjunctivae are normal.  Neck: Neck supple.  Cardiovascular: Normal rate, regular rhythm and normal heart sounds.   Pulmonary/Chest: Effort normal. No respiratory distress. He has no wheezes. He has no rales.  Abdominal: Soft. Bowel sounds are normal. He exhibits no distension. There is no tenderness. There is no rebound.  Musculoskeletal: He exhibits no edema.  Neurological: He is alert and oriented to person, place, and time.  Skin: Skin is warm and dry.  Nursing note and vitals reviewed.   ED Course  Procedures (including critical care time) Labs Review Labs Reviewed - No data to display  Imaging Review No results found. I have personally reviewed and evaluated these images and lab results as part of my medical decision-making.   EKG Interpretation   Date/Time:  Sunday November 18 2014 19:20:22 EDT Ventricular Rate:  49 PR Interval:  158 QRS Duration: 110 QT Interval:  400 QTC Calculation: 361 R Axis:   57 Text Interpretation:  Marked sinus bradycardia Incomplete right bundle  branch block Abnormal ECG normalization of lateral T waves as compared to  prior.  nonspecific ST changes Confirmed by CAMPOS  MD, Lennette Bihari ([id number]) on  11/18/2014 7:24:55 PM      MDM   Final diagnoses:  Secondary hypertension, unspecified    Patient emergency department because he is worried that his blood pressure is elevated. He denies any associated symptoms at this time. Patient has had intermittent chest pain with shortness of breath since his cardiac stent and an MI 6 months ago. He has seen cardiology for this and cardiologist is aware. He denies any worsening in his chest pain. He denies any increased frequency of his chest pain. States that it is  not any different from before. He has not needed to take any nitroglycerin for this. He states he is not concerned about his chest pain and more concerned about his blood pressure which is unusually high for him. His blood pressure emergency department is 151/87. His EKG does not show any signs of ischemia or MI. He is in no distress. I discussed the patient with Dr. Venora Maples who agrees with assessment and also does not believe that he needs any further testing and emergency department. I encouraged him to recheck his blood pressure in one week. If continues to be elevated to follow with his primary care doctor or his cardiologist. Patient voiced understanding. Return precautions discussed   Filed Vitals:   11/18/14 1809  BP: 151/87  Pulse: 56  Temp: 98.3 F (36.8 C)  TempSrc: Oral  Resp: 18  SpO2: 99%       Jeannett Senior, PA-C  11/18/14 [place], MD 11/18/14 [phone]

## 2014-11-18 NOTE — ED Notes (Signed)
Pt verbalized understanding of d/c instructions and has no further questions. Pt stable, pain free and NAD.

## 2014-11-18 NOTE — ED Notes (Signed)
Pt checked his blood pressure at [facility] and it was 159/84.  Pt concerned, but having no symptoms, because he had a MI 6 months ago

## 2014-11-18 NOTE — Discharge Instructions (Signed)
Continue to take your regular medications. Please return to emergency department if you having worsening chest pain. Please have your blood pressure rechecked in one week from today.     Hypertension Hypertension, commonly called high blood pressure, is when the force of blood pumping through your arteries is too strong. Your arteries are the blood vessels that carry blood from your heart throughout your body. A blood pressure reading consists of a higher number over a lower number, such as 110/72. The higher number (systolic) is the pressure inside your arteries when your heart pumps. The lower number (diastolic) is the pressure inside your arteries when your heart relaxes. Ideally you want your blood pressure below 120/80. Hypertension forces your heart to work harder to pump blood. Your arteries may become narrow or stiff. Having untreated or uncontrolled hypertension can cause heart attack, stroke, kidney disease, and other problems. RISK FACTORS Some risk factors for high blood pressure are controllable. Others are not.  Risk factors you cannot control include:   Race. You may be at higher risk if you are African American.  Age. Risk increases with age.  Gender. Men are at higher risk than women before age 26 years. After age 41, women are at higher risk than men. Risk factors you can control include:  Not getting enough exercise or physical activity.  Being overweight.  Getting too much fat, sugar, calories, or salt in your diet.  Drinking too much alcohol. SIGNS AND SYMPTOMS Hypertension does not usually cause signs or symptoms. Extremely high blood pressure (hypertensive crisis) may cause headache, anxiety, shortness of breath, and nosebleed. DIAGNOSIS To check if you have hypertension, your health care provider will measure your blood pressure while you are seated, with your arm held at the level of your heart. It should be measured at least twice using the same arm. Certain  conditions can cause a difference in blood pressure between your right and left arms. A blood pressure reading that is higher than normal on one occasion does not mean that you need treatment. If it is not clear whether you have high blood pressure, you may be asked to return on a different day to have your blood pressure checked again. Or, you may be asked to monitor your blood pressure at home for 1 or more weeks. TREATMENT Treating high blood pressure includes making lifestyle changes and possibly taking medicine. Living a healthy lifestyle can help lower high blood pressure. You may need to change some of your habits. Lifestyle changes may include:  Following the DASH diet. This diet is high in fruits, vegetables, and whole grains. It is low in salt, red meat, and added sugars.  Keep your sodium intake below 2,300 mg per day.  Getting at least 30-45 minutes of aerobic exercise at least 4 times per week.  Losing weight if necessary.  Not smoking.  Limiting alcoholic beverages.  Learning ways to reduce stress. Your health care provider may prescribe medicine if lifestyle changes are not enough to get your blood pressure under control, and if one of the following is true:  You are 52-23 years of age and your systolic blood pressure is above 140.  You are 31 years of age or older, and your systolic blood pressure is above 150.  Your diastolic blood pressure is above 90.  You have diabetes, and your systolic blood pressure is over 956 or your diastolic blood pressure is over 90.  You have kidney disease and your blood pressure is above 140/90.  You have heart disease and your blood pressure is above 140/90. Your personal target blood pressure may vary depending on your medical conditions, your age, and other factors. HOME CARE INSTRUCTIONS  Have your blood pressure rechecked as directed by your health care provider.   Take medicines only as directed by your health care provider.  Follow the directions carefully. Blood pressure medicines must be taken as prescribed. The medicine does not work as well when you skip doses. Skipping doses also puts you at risk for problems.  Do not smoke.   Monitor your blood pressure at home as directed by your health care provider. SEEK MEDICAL CARE IF:   You think you are having a reaction to medicines taken.  You have recurrent headaches or feel dizzy.  You have swelling in your ankles.  You have trouble with your vision. SEEK IMMEDIATE MEDICAL CARE IF:  You develop a severe headache or confusion.  You have unusual weakness, numbness, or feel faint.  You have severe chest or abdominal pain.  You vomit repeatedly.  You have trouble breathing. MAKE SURE YOU:   Understand these instructions.  Will watch your condition.  Will get help right away if you are not doing well or get worse.   This information is not intended to replace advice given to you by your health care provider. Make sure you discuss any questions you have with your health care provider.   Document Released: 01/05/2005 Document Revised: 05/22/2014 Document Reviewed: 10/28/2012 Elsevier Interactive Patient Education [employer].

## 2015-01-08 ENCOUNTER — Other Ambulatory Visit: Payer: Self-pay | Admitting: Physician Assistant

## 2015-01-20 DIAGNOSIS — N200 Calculus of kidney: Secondary | ICD-10-CM

## 2015-01-20 HISTORY — DX: Calculus of kidney: N20.0

## 2015-03-09 ENCOUNTER — Other Ambulatory Visit: Payer: Self-pay | Admitting: Physician Assistant

## 2015-03-12 ENCOUNTER — Telehealth: Payer: Self-pay | Admitting: Interventional Cardiology

## 2015-03-12 NOTE — Telephone Encounter (Signed)
Walk in pt form- pt needs refill on medication- gave to [facility]

## 2015-03-14 NOTE — Telephone Encounter (Signed)
Called pt after receiving a walk in note to call pt. Pt sts that he need a refill on his Brilinta which he already received from his pharmacy. Pt is due for cardiology f/u in March 2017. Pt st that he will be [place] of the [place] from Feb 28-Jun 08 2015. F/u appt scheduled for when he returns 06/20/15 @12pm . Pt aware of appt, and voiced appreciation for the call.

## 2015-06-09 ENCOUNTER — Other Ambulatory Visit: Payer: Self-pay | Admitting: Physician Assistant

## 2015-06-10 ENCOUNTER — Telehealth: Payer: Self-pay | Admitting: Interventional Cardiology

## 2015-06-10 NOTE — Telephone Encounter (Signed)
Rx(s) sent to pharmacy electronically.  

## 2015-06-10 NOTE — Telephone Encounter (Signed)
New message  Pt called states that he was advised that after 1 year exactly there would be a medication change. Pt states that his appt on 06/24/2015 will be a little over 1 year and he is requesting a call back to discuss if he can continue to take his medications. ( No details provided as to which medications the pt is discussing. Please call)

## 2015-06-10 NOTE — Telephone Encounter (Signed)
Lmom. Pt sts his name on his  Voicemail. Pt should continue all his medications as prescribed, until instructed further. He can discuss d/c medications at his visit on 06/24/15. Pt is to call back if questions or concerns.

## 2015-06-20 ENCOUNTER — Ambulatory Visit: Payer: BLUE CROSS/BLUE SHIELD | Admitting: Interventional Cardiology

## 2015-06-24 ENCOUNTER — Encounter: Payer: Self-pay | Admitting: Gastroenterology

## 2015-06-24 ENCOUNTER — Ambulatory Visit (INDEPENDENT_AMBULATORY_CARE_PROVIDER_SITE_OTHER): Payer: BLUE CROSS/BLUE SHIELD | Admitting: Physician Assistant

## 2015-06-24 ENCOUNTER — Encounter: Payer: Self-pay | Admitting: Physician Assistant

## 2015-06-24 VITALS — BP 142/74 | HR 50 | Ht 72.0 in | Wt 148.0 lb

## 2015-06-24 DIAGNOSIS — I2510 Atherosclerotic heart disease of native coronary artery without angina pectoris: Secondary | ICD-10-CM

## 2015-06-24 DIAGNOSIS — E785 Hyperlipidemia, unspecified: Secondary | ICD-10-CM | POA: Diagnosis not present

## 2015-06-24 DIAGNOSIS — I10 Essential (primary) hypertension: Secondary | ICD-10-CM

## 2015-06-24 DIAGNOSIS — K30 Functional dyspepsia: Secondary | ICD-10-CM | POA: Diagnosis not present

## 2015-06-24 DIAGNOSIS — R109 Unspecified abdominal pain: Secondary | ICD-10-CM

## 2015-06-24 LAB — COMPREHENSIVE METABOLIC PANEL
ALT: 44 U/L (ref 9–46)
AST: 19 U/L (ref 10–40)
Albumin: 4.2 g/dL (ref 3.6–5.1)
Alkaline Phosphatase: 62 U/L (ref 40–115)
BUN: 14 mg/dL (ref 7–25)
CO2: 28 mmol/L (ref 20–31)
Calcium: 9.5 mg/dL (ref 8.6–10.3)
Chloride: 102 mmol/L (ref 98–110)
Creat: 0.82 mg/dL (ref 0.60–1.35)
Glucose, Bld: 106 mg/dL — ABNORMAL HIGH (ref 65–99)
Potassium: 4.3 mmol/L (ref 3.5–5.3)
Sodium: 139 mmol/L (ref 135–146)
Total Bilirubin: 0.6 mg/dL (ref 0.2–1.2)
Total Protein: 7 g/dL (ref 6.1–8.1)

## 2015-06-24 LAB — LIPID PANEL
Cholesterol: 115 mg/dL — ABNORMAL LOW (ref 125–200)
HDL: 36 mg/dL — ABNORMAL LOW (ref >=40)
LDL Cholesterol: 67 mg/dL (ref <130)
Total CHOL/HDL Ratio: 3.2 Ratio (ref <=5.0)
Triglycerides: 62 mg/dL (ref <150)
VLDL: 12 mg/dL (ref <30)

## 2015-06-24 LAB — CBC
HCT: 43.2 % (ref 38.5–50.0)
Hemoglobin: 14.6 g/dL (ref 13.2–17.1)
MCH: 29 pg (ref 27.0–33.0)
MCHC: 33.8 g/dL (ref 32.0–36.0)
MCV: 85.9 fL (ref 80.0–100.0)
MPV: 9.7 fL (ref 7.5–12.5)
Platelets: 261 10*3/uL (ref 140–400)
RBC: 5.03 MIL/uL (ref 4.20–5.80)
RDW: 14.3 % (ref 11.0–15.0)
WBC: 4.4 10*3/uL (ref 3.8–10.8)

## 2015-06-24 NOTE — Progress Notes (Signed)
Cardiology Office Note    Date:  06/24/2015   ID:  TJADEN BLAMER, DOB 1970-08-29, MRN [id number]  PCP:  Lorayne Marek, MD  Cardiologist: Dr. Tamala Julian  Chief complaint stomach fullness  History of Present Illness:  Jeremy Vazquez is a 45 y.o. male  who presents for Follow-up of CAD with  history of LAD STEMI (DES mid vessel)05/2014, transient acute systolic heart failure resolved to diastolic heart failure with EF greater than 50%, essential hypertension, and hyperlipidemia.  Patient comes in today feeling quite well. He is running and exercising regularly without difficulty. He denies chest pain, palpitations, dyspnea, dyspnea on exertion, dizziness or presyncope. His main complaint is stomach fullness. He says he can eat once a day and feels full all day long. He was back in [place] and was given a medication from a doctor there but it did not help. He lost a little weight but says it's because he's not used to the food over there anymore. He would like to see a gastroenterologist. He keeps close tabs on his blood pressure and pulse at work and both have been normal.    Past Medical History  Diagnosis Date  . Hypertension   . CAD (coronary artery disease)     a. STEMI 05/2014 - s/p DES to LAD.  . Ischemic cardiomyopathy     a. EF 35-40% by cath at time of STEMI 05/2014.  Marland Kitchen Dyslipidemia     Past Surgical History  Procedure Laterality Date  . Cardiac catheterization N/A 06/08/2014    Procedure: Left Heart Cath and Coronary Angiography;  Surgeon: Belva Crome, MD;  Location: [facility];  Service: Cardiovascular;  Laterality: N/A;  . Cardiac catheterization N/A 06/08/2014    Procedure: Coronary Stent Intervention;  Surgeon: Belva Crome, MD;  Location: [facility];  Service: Cardiovascular;  Laterality: N/A;    Current Medications: Outpatient Prescriptions Prior to Visit  Medication Sig Dispense Refill  . ASPIRIN LOW DOSE 81 MG EC tablet TAKE 1 TABLET(81 MG) BY MOUTH  DAILY 30 tablet 10  . atorvastatin (LIPITOR) 80 MG tablet TAKE 1 TABLET(80 MG) BY MOUTH EVERY EVENING 30 tablet 1  . lisinopril (PRINIVIL,ZESTRIL) 2.5 MG tablet TAKE 1 TABLET(2.5 MG) BY MOUTH DAILY 30 tablet 8  . metoprolol succinate (TOPROL-XL) 25 MG 24 hr tablet TAKE 1 TABLET(25 MG) BY MOUTH DAILY 30 tablet 8  . nitroGLYCERIN (NITROSTAT) 0.4 MG SL tablet Place 1 tablet (0.4 mg total) under the tongue every 5 (five) minutes as needed for chest pain (up to 3 doses). 25 tablet 3  . simethicone (MYLICON) 80 MG chewable tablet Chew 80 mg by mouth every 6 (six) hours as needed for flatulence.    . ticagrelor (BRILINTA) 90 MG TABS tablet Take 1 tablet (90 mg total) by mouth 2 (two) times daily. 60 tablet 11  . atorvastatin (LIPITOR) 40 MG tablet Take 1 tablet (40 mg total) by mouth every evening. (Patient not taking: Reported on 06/24/2015)     No facility-administered medications prior to visit.     Allergies:   Review of patient's allergies indicates no known allergies.   Social History   Social History  . Marital Status: Single    Spouse Name: N/A  . Number of Children: N/A  . Years of Education: N/A   Social History Main Topics  . Smoking status: Never Smoker   . Smokeless tobacco: Never Used  . Alcohol Use: No  . Drug Use: No  .  Sexual Activity: Not Asked   Other Topics Concern  . None   Social History Narrative     Family History:  The patient's family history includes Diabetes type II in his brother, brother, mother, and sister; Hypertension in his brother; Other in his father.   ROS:   Please see the history of present illness.    Review of Systems  Constitution: Negative.  HENT: Negative.   Cardiovascular: Negative.   Respiratory: Negative.   Endocrine: Negative.   Hematologic/Lymphatic: Negative.   Musculoskeletal: Negative.   Gastrointestinal: Negative.        Abdominal fullness  Genitourinary: Positive for frequency.  Neurological: Negative.    All other  systems reviewed and are negative.   PHYSICAL EXAM:   VS:  BP 142/74 mmHg  Pulse 50  Ht 6' (1.829 m)  Wt 148 lb (67.132 kg)  BMI 20.07 kg/m2   GEN: Well nourished, well developed, in no acute distress HEENT: normal Neck: no JVD, carotid bruits, or masses Cardiac: RRR; is of S4, no murmurs, rubs, no edema  Respiratory:  clear to auscultation bilaterally, normal work of breathing GI: soft, nontender, nondistended, + BS MS: no deformity or atrophy Skin: warm and dry, no rash Neuro:  Alert and Oriented x 3, Strength and sensation are intact Psych: euthymic mood, full affect  Wt Readings from Last 3 Encounters:  06/24/15 148 lb (67.132 kg)  09/27/14 155 lb 9.6 oz (70.58 kg)  06/20/14 153 lb 9.6 oz (69.673 kg)      Studies/Labs Reviewed:   EKG:  EKG is Not ordered today.   Recent Labs: 08/15/2014: ALT 33   Lipid Panel    Component Value Date/Time   CHOL 98 08/15/2014 0856   TRIG 136.0 08/15/2014 0856   HDL 28.10* 08/15/2014 0856   CHOLHDL 3 08/15/2014 0856   VLDL 27.2 08/15/2014 0856   LDLCALC 43 08/15/2014 0856    Additional studies/ records that were reviewed today include:  Conclusion      Prox thrombus containing LAD lesion, 99% stenosed. There is a 0% residual stenosis post intervention.  A drug-eluting stent was placed.    Acute coronary syndrome due 99% thrombotic proximal to mid LAD.  Successful DES implantation with reduction in stenosis from 99% to 0% with TIMI grade 3 flow.  Normal circumflex and right coronary arteries  Left ventricular dysfunction, acute systolic, with mid to distal and apical anterior wall akinesis. Estimated ejection fraction 35-40%. I anticipate that this will recover to normal.   RECOMMENDATIONS:  Dual antiplatelet therapy for 12 months  Low-dose beta blocker and ACE inhibitor therapy is started but will be limited by blood pressure initially  High intensity statin therapy  Patient will be fast track and could potentially  be discharged from the hospital between 48 and 72 hours if no complications  Early ambulation this a.m. with transferred to telemetry in 12-18 hours if stable.  Early cardiac rehabilitation       ASSESSMENT:    1. CAD in native artery   2. Hyperlipemia   3. Stomach discomfort      PLAN:  In order of problems listed above:  CAD status post MI treated with DES to the LAD 05/2014 EF has improved to 55% and he is without angina. Will discuss how long he needs to be on Brilinta with Dr. Tamala Julian.  Hyperlipidemia check fasting lipid panel, continue Lipitor  Stomach discomfort patient complains of abdominal fullness and discomfort as well as decreased appetite after one meal.  Refer to GI  Essential hypertension controlled    Medication Adjustments/Labs and Tests Ordered: Current medicines are reviewed at length with the patient today.  Concerns regarding medicines are outlined above.  Medication changes, Labs and Tests ordered today are listed in the Patient Instructions below. Patient Instructions  Medication Instructions:   Your physician recommends that you continue on your current medications as directed. Please refer to the Current Medication list given to you today.   If you need a refill on your cardiac medications before your next appointment, please call your pharmacy.  Labwork: CBC CMET LIPID   Testing/Procedures:  NONE ORDER TODAY  You have been referred to *[facility]   Follow-Up:  Your physician wants you to follow-up in: [place] will receive a reminder letter in the mail two months in advance. If you don't receive a letter, please call our office to schedule the follow-up appointment.     Any Other Special Instructions Will Be Listed Below (If Applicable).                                                                                                                                                        Sumner Boast, PA-C  06/24/2015 12:17 PM    [facility] [address], [place], [place]  [id number] Phone: [phone]; Fax: [phone]

## 2015-06-24 NOTE — Patient Instructions (Addendum)
Medication Instructions:   Your physician recommends that you continue on your current medications as directed. Please refer to the Current Medication list given to you today.   If you need a refill on your cardiac medications before your next appointment, please call your pharmacy.  Labwork: CBC CMET LIPID   Testing/Procedures:  NONE ORDER TODAY  You have been referred to *[facility]   Follow-Up:  Your physician wants you to follow-up in: [place] will receive a reminder letter in the mail two months in advance. If you don't receive a letter, please call our office to schedule the follow-up appointment.     Any Other Special Instructions Will Be Listed Below (If Applicable).

## 2015-06-26 ENCOUNTER — Other Ambulatory Visit: Payer: Self-pay

## 2015-06-26 ENCOUNTER — Telehealth: Payer: Self-pay

## 2015-06-26 DIAGNOSIS — E785 Hyperlipidemia, unspecified: Secondary | ICD-10-CM

## 2015-06-26 NOTE — Telephone Encounter (Signed)
Pt aware per Dr.Smith. It is ok to to [facility]. Pt should continue all other medications as prescribed. Pt verbalized understanding and voiced appreciation for the call.

## 2015-08-21 ENCOUNTER — Ambulatory Visit (INDEPENDENT_AMBULATORY_CARE_PROVIDER_SITE_OTHER): Payer: BLUE CROSS/BLUE SHIELD | Admitting: Gastroenterology

## 2015-08-21 ENCOUNTER — Encounter: Payer: Self-pay | Admitting: Gastroenterology

## 2015-08-21 VITALS — BP 118/78 | HR 52 | Ht 70.75 in | Wt 150.2 lb

## 2015-08-21 DIAGNOSIS — R140 Abdominal distension (gaseous): Secondary | ICD-10-CM | POA: Diagnosis not present

## 2015-08-21 DIAGNOSIS — I2510 Atherosclerotic heart disease of native coronary artery without angina pectoris: Secondary | ICD-10-CM

## 2015-08-21 DIAGNOSIS — R1084 Generalized abdominal pain: Secondary | ICD-10-CM

## 2015-08-21 DIAGNOSIS — K5900 Constipation, unspecified: Secondary | ICD-10-CM | POA: Diagnosis not present

## 2015-08-21 MED ORDER — NA SULFATE-K SULFATE-MG SULF 17.5-3.13-1.6 GM/177ML PO SOLN: 1.0000 | Freq: Once | ORAL | 0 refills | Status: AC

## 2015-08-21 NOTE — Progress Notes (Signed)
[facility] Consult Note:  History: Jeremy Vazquez 08/21/2015  Referring physician: Ermalinda Barrios, PA (Cardiology clinic)  Reason for consult/chief complaint: early satiety (always feel full, feels something pushing up in the esophagus when he gets full) and Gas   Subjective  HPI:  This is a 45 year old [place] man referred by the cardiology clinic for abdominal pain.  Saw Cardiology recently, and they said: "Jeremy Vazquez is a 45 y.o. male  who presents for Follow-up of CAD with  history of LAD STEMI (DES mid vessel)05/2014, transient acute systolic heart failure resolved to diastolic heart failure with EF greater than 50%, essential hypertension, and hyperlipidemia.   Patient comes in today feeling quite well. He is running and exercising regularly without difficulty. He denies chest pain, palpitations, dyspnea, dyspnea on exertion, dizziness or presyncope. His main complaint is stomach fullness. He says he can eat once a day and feels full all day long. He was back in [place] and was given a medication from a doctor there but it did not help. He lost a little weight but says it's because he's not used to the food over there anymore. He would like to see a gastroenterologist. He keeps close tabs on his blood pressure and pulse at work and both have been normal."  He describes several months of generalized abdominal bloating fullness and some upper pressure, especially after he eats. It is not clear if there are any consistent food triggers. He started having more fruits and vegetables and this relieved some constipation, but the pain did not seem to change. He denies rectal bleeding, unintentional weight loss (he lost a few pounds while fasting during Ramadan, but but it back on afterwards), dysphagia or vomiting.   ROS:  Review of Systems  Constitutional: Negative for appetite change and unexpected weight change.  HENT: Negative for mouth sores and voice change.    Eyes: Negative for pain (.cbc) and redness.  Respiratory: Negative for cough and shortness of breath.   Cardiovascular: Negative for chest pain and palpitations.  Genitourinary: Negative for dysuria and hematuria.  Musculoskeletal: Negative for arthralgias and myalgias.  Skin: Negative for pallor and rash.  Neurological: Negative for weakness and headaches.  Hematological: Negative for adenopathy.     Past Medical History: Past Medical History:  Diagnosis Date  . CAD (coronary artery disease)    a. STEMI 05/2014 - s/p DES to LAD.  Marland Kitchen Dyslipidemia   . Hypertension   . Ischemic cardiomyopathy    a. EF 35-40% by cath at time of STEMI 05/2014.     Past Surgical History: Past Surgical History:  Procedure Laterality Date  . CARDIAC CATHETERIZATION N/A 06/08/2014   Procedure: Left Heart Cath and Coronary Angiography;  Surgeon: Belva Crome, MD;  Location: [facility];  Service: Cardiovascular;  Laterality: N/A;  . CARDIAC CATHETERIZATION N/A 06/08/2014   Procedure: Coronary Stent Intervention;  Surgeon: Belva Crome, MD;  Location: [facility];  Service: Cardiovascular;  Laterality: N/A;     Family History: Family History  Problem Relation Age of Onset  . Diabetes type II Mother   . Other Father     STOMACH ISSUES  . Diabetes type II Sister   . Diabetes type II Brother   . Hypertension Brother   . Diabetes type II Brother     Social History: Social History   Social History  . Marital status: Single    Spouse name: N/A  . Number of children: 7  .  Years of education: N/A   Occupational History  . [employer]    Social History Main Topics  . Smoking status: Never Smoker  . Smokeless tobacco: Never Used  . Alcohol use No  . Drug use: No  . Sexual activity: Not Asked   Other Topics Concern  . None   Social History Narrative  . None    Allergies: No Known Allergies  Outpatient Meds: Current Outpatient Prescriptions  Medication Sig  Dispense Refill  . ASPIRIN LOW DOSE 81 MG EC tablet TAKE 1 TABLET(81 MG) BY MOUTH DAILY 30 tablet 10  . atorvastatin (LIPITOR) 80 MG tablet TAKE 1 TABLET(80 MG) BY MOUTH EVERY EVENING 30 tablet 1  . lisinopril (PRINIVIL,ZESTRIL) 2.5 MG tablet TAKE 1 TABLET(2.5 MG) BY MOUTH DAILY 30 tablet 8  . metoprolol succinate (TOPROL-XL) 25 MG 24 hr tablet TAKE 1 TABLET(25 MG) BY MOUTH DAILY 30 tablet 8  . Na Sulfate-K Sulfate-Mg Sulf 17.5-3.13-1.6 GM/180ML SOLN Take 1 kit by mouth once. 354 mL 0  . nitroGLYCERIN (NITROSTAT) 0.4 MG SL tablet Place 1 tablet (0.4 mg total) under the tongue every 5 (five) minutes as needed for chest pain (up to 3 doses). (Patient not taking: Reported on 08/21/2015) 25 tablet 3   No current facility-administered medications for this visit.       ___________________________________________________________________ Objective   Exam:  BP 118/78 (BP Location: Left Arm, Patient Position: Sitting, Cuff Size: Normal)   Pulse (!) 52   Ht 5' 10.75" (1.797 m) Comment: height measured without shoes  Wt 150 lb 4 oz (68.2 kg)   BMI 21.10 kg/m    General: this is a(n) Thin well-appearing middle-aged man   Eyes: sclera anicteric, no redness  ENT: oral mucosa moist without lesions, no cervical or supraclavicular lymphadenopathy, good dentition  CV: RRR without murmur, S1/S2, no JVD, no peripheral edema  Resp: clear to auscultation bilaterally, normal RR and effort noted  GI: soft, mild epigastric tenderness, with active bowel sounds. No guarding or palpable organomegaly noted.  Skin; warm and dry, no rash or jaundice noted  Neuro: awake, alert and oriented x 3. Normal gross motor function and fluent speech  Labs:  CBC    Component Value Date/Time   WBC 4.4 06/24/2015 1242   RBC 5.03 06/24/2015 1242   HGB 14.6 06/24/2015 1242   HCT 43.2 06/24/2015 1242   PLT 261 06/24/2015 1242   MCV 85.9 06/24/2015 1242   MCH 29.0 06/24/2015 1242   MCHC 33.8 06/24/2015 1242   RDW  14.3 06/24/2015 1242   LYMPHSABS 1.6 11/15/2012 1023   MONOABS 0.3 11/15/2012 1023   EOSABS 0.3 11/15/2012 1023   BASOSABS 0.0 11/15/2012 1023   CMP Latest Ref Rng & Units 06/24/2015 08/15/2014 06/09/2014  Glucose 65 - 99 mg/dL 106(H) - 78  BUN 7 - 25 mg/dL 14 - 13  Creatinine 0.60 - 1.35 mg/dL 0.82 - 0.83  Sodium 135 - 146 mmol/L 139 - 138  Potassium 3.5 - 5.3 mmol/L 4.3 - 4.0  Chloride 98 - 110 mmol/L 102 - 99(L)  CO2 20 - 31 mmol/L 28 - 30  Calcium 8.6 - 10.3 mg/dL 9.5 - 10.1  Total Protein 6.1 - 8.1 g/dL 7.0 7.5 -  Total Bilirubin 0.2 - 1.2 mg/dL 0.6 0.5 -  Alkaline Phos 40 - 115 U/L 62 66 -  AST 10 - 40 U/L 19 24 -  ALT 9 - 46 U/L 44 33 -    No abdominal imaging is found  Assessment: Encounter Diagnoses  Name Primary?  . Generalized abdominal pain Yes  . Abdominal bloating   . Constipation, unspecified constipation type   . Coronary artery disease involving native coronary artery of native heart without angina pectoris     Symptoms somewhat vague and difficult to completely characterize. He does not seem to have red flag symptoms to indicate a worrisome cause, but his history is somewhat limited. See some medicine in [place] but did not have any further testing. Her to some blood work done, but it is not clear if he was tested for H. pylori. In addition, he has change in bowel habits and it is not clear if they're just dietary related.  Plan:  EGD and colonoscopy  The benefits and risks of the planned procedure were described in detail with the patient or (when appropriate) their health care proxy.  Risks were outlined as including, but not limited to, bleeding, infection, perforation, adverse medication reaction leading to cardiac or pulmonary decompensation, or pancreatitis (if ERCP).  The limitation of incomplete mucosal visualization was also discussed.  No guarantees or warranties were given.   Thank you for the courtesy of this consult.  Please call me with any questions  or concerns.  Nelida Meuse III  CC: Ermalinda Barrios, PA

## 2015-08-21 NOTE — Patient Instructions (Signed)
If you are age 47 or older, your body mass index should be between 23-30. Your Body mass index is 21.1 kg/m. If this is out of the aforementioned range listed, please consider follow up with your Primary Care Provider.  If you are age 75 or younger, your body mass index should be between 19-25. Your Body mass index is 21.1 kg/m. If this is out of the aformentioned range listed, please consider follow up with your Primary Care Provider.   You have been scheduled for a colonoscopy/EGD. Please follow written instructions given to you at your visit today.  Please pick up your prep supplies at the pharmacy within the next 1-3 days. If you use inhalers (even only as needed), please bring them with you on the day of your procedure. Your physician has requested that you go to www.startemmi.com and enter the access code given to you at your visit today. This web site gives a general overview about your procedure. However, you should still follow specific instructions given to you by our office regarding your preparation for the procedure.  We have sent the following medications to your pharmacy for you to pick up at your convenience: Suprep  Thank you for choosing [facility]  Dr Wilfrid Lund III

## 2015-09-13 ENCOUNTER — Encounter: Payer: Self-pay | Admitting: Gastroenterology

## 2015-09-24 ENCOUNTER — Telehealth: Payer: Self-pay | Admitting: *Deleted

## 2015-09-24 NOTE — Telephone Encounter (Signed)
Called patient to inquire if he could come in early for procedure tomorrow. Dr. Loletha Carrow had requested the patient come at 1230. Patient stating he can come in at 1230. Reviewed with the patient, prep and npo status times. (start prep this pm at 1800, start am prep at 0830 until 1030, with npo after 1030)  Patient verbalized understanding. Notified Dr. Loletha Carrow' nurse Barb Merino, RN of acceptance of earlier arrival time.

## 2015-09-25 ENCOUNTER — Ambulatory Visit (AMBULATORY_SURGERY_CENTER): Payer: BLUE CROSS/BLUE SHIELD | Admitting: Gastroenterology

## 2015-09-25 ENCOUNTER — Encounter: Payer: Self-pay | Admitting: Gastroenterology

## 2015-09-25 VITALS — BP 123/97 | HR 52 | Temp 96.2°F | Resp 12 | Ht 70.75 in | Wt 150.0 lb

## 2015-09-25 DIAGNOSIS — K2970 Gastritis, unspecified, without bleeding: Secondary | ICD-10-CM

## 2015-09-25 DIAGNOSIS — R1084 Generalized abdominal pain: Secondary | ICD-10-CM | POA: Diagnosis present

## 2015-09-25 DIAGNOSIS — K2950 Unspecified chronic gastritis without bleeding: Secondary | ICD-10-CM | POA: Diagnosis not present

## 2015-09-25 DIAGNOSIS — D120 Benign neoplasm of cecum: Secondary | ICD-10-CM | POA: Diagnosis not present

## 2015-09-25 DIAGNOSIS — B9681 Helicobacter pylori [H. pylori] as the cause of diseases classified elsewhere: Secondary | ICD-10-CM | POA: Diagnosis not present

## 2015-09-25 MED ORDER — SODIUM CHLORIDE 0.9 % IV SOLN: 500.0000 mL | INTRAVENOUS | Status: DC

## 2015-09-25 NOTE — Progress Notes (Signed)
Called to room to assist during endoscopic procedure.  Patient ID and intended procedure confirmed with present staff. Received instructions for my participation in the procedure from the performing physician.  

## 2015-09-25 NOTE — Op Note (Addendum)
[facility] Patient Name: Jeremy Vazquez Procedure Date: 09/25/2015 1:19 PM MRN: [id number] Endoscopist: Mallie Mussel L. Loletha Carrow , MD Age: 45 Referring MD:  Date of Birth: [birth year] Gender: Male Account #: [id number] Procedure:                Upper GI endoscopy Indications:              Generalized abdominal pain, Abdominal bloating,                            Early satiety Medicines:                Monitored Anesthesia Care Procedure:                Pre-Anesthesia Assessment:                           - Prior to the procedure, a History and Physical                            was performed, and patient medications and                            allergies were reviewed. The patient's tolerance of                            previous anesthesia was also reviewed. The risks                            and benefits of the procedure and the sedation                            options and risks were discussed with the patient.                            All questions were answered, and informed consent                            was obtained. Anticoagulants: The patient has taken                            aspirin. It was decided not to withhold this                            medication prior to the procedure. ASA Grade                            Assessment: III - A patient with severe systemic                            disease. After reviewing the risks and benefits,                            the patient was deemed in satisfactory condition to  undergo the procedure.                           After obtaining informed consent, the endoscope was                            passed under direct vision. Throughout the                            procedure, the patient's blood pressure, pulse, and                            oxygen saturations were monitored continuously. The                            Model GIF-HQ190 [id number]) scope was introduced            through the mouth, and advanced to the second part                            of duodenum. The upper GI endoscopy was                            accomplished without difficulty. The patient                            tolerated the procedure well. Findings:                 The esophagus was normal.                           Diffuse mild inflammation characterized by erosions                            and erythema was found in the gastric antrum.                            Biopsies were taken with a cold forceps for                            histology.                           The cardia and gastric fundus were normal on                            retroflexion.                           The examined duodenum was normal. Complications:            No immediate complications. Estimated Blood Loss:     Estimated blood loss was minimal. Impression:               - Normal esophagus.                           -  Chronic gastritis. Biopsied. Likely either                            aspirin gastropathy or H. pylori.                           - Normal examined duodenum. Recommendation:           - Patient has a contact number available for                            emergencies. The signs and symptoms of potential                            delayed complications were discussed with the                            patient. Return to normal activities tomorrow.                            Written discharge instructions were provided to the                            patient.                           - Resume previous diet.                           - Continue present medications.                           - Await pathology results.                           - See the other procedure note for documentation of                            additional recommendations. Henry L. Loletha Carrow, MD 09/25/2015 2:05:46 PM This report has been signed electronically.

## 2015-09-25 NOTE — Patient Instructions (Addendum)
YOU HAD AN ENDOSCOPIC PROCEDURE TODAY AT [facility]:   Refer to the procedure report that was given to you for any specific questions about what was found during the examination.  If the procedure report does not answer your questions, please call your gastroenterologist to clarify.  If you requested that your care partner not be given the details of your procedure findings, then the procedure report has been included in a sealed envelope for you to review at your convenience later.  YOU SHOULD EXPECT: Some feelings of bloating in the abdomen. Passage of more gas than usual.  Walking can help get rid of the air that was put into your GI tract during the procedure and reduce the bloating. If you had a lower endoscopy (such as a colonoscopy or flexible sigmoidoscopy) you may notice spotting of blood in your stool or on the toilet paper. If you underwent a bowel prep for your procedure, you may not have a normal bowel movement for a few days.  Please Note:  You might notice some irritation and congestion in your nose or some drainage.  This is from the oxygen used during your procedure.  There is no need for concern and it should clear up in a day or so.  SYMPTOMS TO REPORT IMMEDIATELY:   Following lower endoscopy (colonoscopy or flexible sigmoidoscopy):  Excessive amounts of blood in the stool  Significant tenderness or worsening of abdominal pains  Swelling of the abdomen that is new, acute  Fever of 100F or higher   Following upper endoscopy (EGD)  Vomiting of blood or coffee ground material  New chest pain or pain under the shoulder blades  Painful or persistently difficult swallowing  New shortness of breath  Fever of 100F or higher  Black, tarry-looking stools  For urgent or emergent issues, a gastroenterologist can be reached at any hour by calling [phone].   DIET:  We do recommend a small meal at first, but then you may proceed to your regular diet.  Drink  plenty of fluids but you should avoid alcoholic beverages for 24 hours.  ACTIVITY:  You should plan to take it easy for the rest of today and you should NOT DRIVE or use heavy machinery until tomorrow (because of the sedation medicines used during the test).    FOLLOW UP: Our staff will call the number listed on your records the next business day following your procedure to check on you and address any questions or concerns that you may have regarding the information given to you following your procedure. If we do not reach you, we will leave a message.  However, if you are feeling well and you are not experiencing any problems, there is no need to return our call.  We will assume that you have returned to your regular daily activities without incident.  If any biopsies were taken you will be contacted by phone or by letter within the next 1-3 weeks.  Please call us at [phone] if you have not heard about the biopsies in 3 weeks.    SIGNATURES/CONFIDENTIALITY: You and/or your care partner have signed paperwork which will be entered into your electronic medical record.  These signatures attest to the fact that that the information above on your After Visit Summary has been reviewed and is understood.  Full responsibility of the confidentiality of this discharge information lies with you and/or your care-partner.   Resume medications. Information given on Gastritis and polyps.

## 2015-09-25 NOTE — Progress Notes (Signed)
  [facility] Post-op Note  Patient: Jeremy Vazquez  Procedure(s) Performed: colonoscopy and endoscopy  Patient Location: [facility] - Recovery Area  Anesthesia Type: Deep Sedation/Propofol  Level of Consciousness: awake, oriented and patient cooperative  Airway and Oxygen Therapy: Patient Spontanous Breathing  Post-op Pain: none  Post-op Assessment:  Post-op Vital signs reviewed, Patient's Cardiovascular Status Stable, Respiratory Function Stable, Patent Airway, No signs of Nausea or vomiting and Pain level controlled  Post-op Vital Signs: Reviewed and stable  Complications: No apparent anesthesia complications  Jeremy Vazquez 2:07 PM

## 2015-09-25 NOTE — Op Note (Signed)
[facility] Patient Name: Jeremy Vazquez Procedure Date: 09/25/2015 1:19 PM MRN: [id number] Endoscopist: Mallie Mussel L. Loletha Carrow , MD Age: 45 Referring MD:  Date of Birth: 1970/03/22 Gender: Male Account #: [id number] Procedure:                Colonoscopy Indications:              Generalized abdominal pain Medicines:                Monitored Anesthesia Care Procedure:                Pre-Anesthesia Assessment:                           - Prior to the procedure, a History and Physical                            was performed, and patient medications and                            allergies were reviewed. The patient's tolerance of                            previous anesthesia was also reviewed. The risks                            and benefits of the procedure and the sedation                            options and risks were discussed with the patient.                            All questions were answered, and informed consent                            was obtained. Anticoagulants: The patient has taken                            aspirin. It was decided not to withhold this                            medication prior to the procedure. ASA Grade                            Assessment: III - A patient with severe systemic                            disease. After reviewing the risks and benefits,                            the patient was deemed in satisfactory condition to                            undergo the procedure.                           -  Prior to the procedure, a History and Physical                            was performed, and patient medications and                            allergies were reviewed. The patient's tolerance of                            previous anesthesia was also reviewed. The risks                            and benefits of the procedure and the sedation                            options and risks were discussed with the patient.            All questions were answered, and informed consent                            was obtained. Anticoagulants: The patient has taken                            aspirin. It was decided not to withhold this                            medication prior to the procedure. ASA Grade                            Assessment: III - A patient with severe systemic                            disease. After reviewing the risks and benefits,                            the patient was deemed in satisfactory condition to                            undergo the procedure.                           After obtaining informed consent, the colonoscope                            was passed under direct vision. Throughout the                            procedure, the patient's blood pressure, pulse, and                            oxygen saturations were monitored continuously. The                            Model CF-HQ190L [id number]) scope was introduced  through the anus and advanced to the the cecum,                            identified by appendiceal orifice and ileocecal                            valve. The colonoscopy was performed without                            difficulty. The patient tolerated the procedure                            well. The quality of the bowel preparation was                            excellent. The ileocecal valve, appendiceal                            orifice, and rectum were photographed. The quality                            of the bowel preparation was evaluated using the                            BBPS [facility] Bowel Preparation Scale) with scores                            of: Right Colon = 3, Transverse Colon = 3 and Left                            Colon = 3 (entire mucosa seen well with no residual                            staining, small fragments of stool or opaque                            liquid). The total BBPS score equals 9. The  bowel                            preparation used was SUPREP. Scope In: 1:44:20 PM Scope Out: 2:01:07 PM Scope Withdrawal Time: 0 hours 13 minutes 23 seconds  Total Procedure Duration: 0 hours 16 minutes 47 seconds  Findings:                 The perianal and digital rectal examinations were                            normal.                           A 4 mm polyp was found in the cecum. The polyp was  sessile. The polyp was removed with a cold snare.                            Resection and retrieval were complete.                           A prominent nest of submucosal veins was found in                            the mid descending colon. It was probed with the                            biopsy forceps and found to be soft. As it appeared                            to most likely be a venous lake, it was not                            biopsied.                           Retroflexion in the rectum was not performed due to                            anatomy. Complications:            No immediate complications. Estimated Blood Loss:     Estimated blood loss: none. Impression:               - One 4 mm polyp in the cecum, removed with a cold                            snare. Resected and retrieved.                           - Prominent nest of submucosal veins mucosa in the                            mid descending colon.                           Colonoscopic findings are felt not likely to                            explain the patient's symptoms. Recommendation:           - Patient has a contact number available for                            emergencies. The signs and symptoms of potential                            delayed complications were discussed with the  patient. Return to normal activities tomorrow.                            Written discharge instructions were provided to the                            patient.                            - Resume previous diet.                           - Continue present medications.                           - Await pathology results.                           - Await pathology results.                           - Repeat colonoscopy is recommended for                            surveillance. The colonoscopy date will be                            determined after pathology results from today's                            exam become available for review. Evadene Wardrip L. Loletha Carrow, MD 09/25/2015 2:24:26 PM This report has been signed electronically.

## 2015-09-26 ENCOUNTER — Telehealth: Payer: Self-pay

## 2015-09-26 NOTE — Telephone Encounter (Signed)
Left message on answering machine. 

## 2015-10-06 ENCOUNTER — Other Ambulatory Visit: Payer: Self-pay | Admitting: Interventional Cardiology

## 2015-10-07 ENCOUNTER — Other Ambulatory Visit: Payer: Self-pay | Admitting: *Deleted

## 2015-10-07 MED ORDER — LISINOPRIL 2.5 MG PO TABS: ORAL_TABLET | ORAL | 2 refills | Status: DC

## 2015-10-07 MED ORDER — ATORVASTATIN CALCIUM 80 MG PO TABS: ORAL_TABLET | ORAL | 2 refills | Status: DC

## 2015-10-07 MED ORDER — NITROGLYCERIN 0.4 MG SL SUBL: 0.4000 mg | SUBLINGUAL_TABLET | SUBLINGUAL | 1 refills | Status: DC | PRN

## 2015-10-07 MED ORDER — METOPROLOL SUCCINATE ER 25 MG PO TB24: ORAL_TABLET | ORAL | 2 refills | Status: DC

## 2015-10-14 ENCOUNTER — Telehealth: Payer: Self-pay | Admitting: Gastroenterology

## 2015-10-16 ENCOUNTER — Other Ambulatory Visit: Payer: Self-pay

## 2015-10-16 DIAGNOSIS — R109 Unspecified abdominal pain: Secondary | ICD-10-CM

## 2015-10-16 MED ORDER — BIS SUBCIT-METRONID-TETRACYC 140-125-125 MG PO CAPS: 3.0000 | ORAL_CAPSULE | Freq: Three times a day (TID) | ORAL | 0 refills | Status: DC

## 2015-10-16 MED ORDER — OMEPRAZOLE 20 MG PO CPDR: 20.0000 mg | DELAYED_RELEASE_CAPSULE | Freq: Two times a day (BID) | ORAL | 0 refills | Status: DC

## 2015-10-16 NOTE — Telephone Encounter (Signed)
Spoke to patient re: EGD results. Patient understands that he needs to start on two medications for H-Pylori. Pylera and Omeprazole, Rx sent to his pharmacy. Called patient back and left message per patient request re: date/time of CT ab/pelvis ordered and scheduled for 10/18/15 @ 3:00. Instructed patient to be NPO after 9:00 a.m and to arrive at [facility] at 12:45 p.m for a 3:00 test.

## 2015-10-17 ENCOUNTER — Other Ambulatory Visit: Payer: Self-pay

## 2015-10-18 ENCOUNTER — Ambulatory Visit (HOSPITAL_COMMUNITY): Payer: BLUE CROSS/BLUE SHIELD

## 2015-10-23 ENCOUNTER — Ambulatory Visit (HOSPITAL_COMMUNITY)
Admission: RE | Admit: 2015-10-23 | Discharge: 2015-10-23 | Disposition: A | Discharge status: Home / Self Care | Payer: BLUE CROSS/BLUE SHIELD | Source: Ambulatory Visit | Attending: Gastroenterology | Admitting: Gastroenterology

## 2015-10-23 ENCOUNTER — Telehealth: Payer: Self-pay

## 2015-10-23 DIAGNOSIS — R935 Abnormal findings on diagnostic imaging of other abdominal regions, including retroperitoneum: Secondary | ICD-10-CM | POA: Diagnosis not present

## 2015-10-23 DIAGNOSIS — R109 Unspecified abdominal pain: Secondary | ICD-10-CM | POA: Insufficient documentation

## 2015-10-23 MED ORDER — IOPAMIDOL (ISOVUE-300) INJECTION 61%
100.0000 mL | Freq: Once | INTRAVENOUS | Status: AC | PRN
  Administered 2015-10-23: 100 mL via INTRAVENOUS

## 2015-10-23 NOTE — Telephone Encounter (Signed)
Routed message to Dr. Loletha Carrow to review CT results.

## 2015-10-23 NOTE — Telephone Encounter (Signed)
Chart and procedure note reviewed. Abdominal complaints are chronic. The finding is subtle. This can wait for Dr. Loletha Carrow to provide further direction. Thanks for checking.

## 2015-10-23 NOTE — Telephone Encounter (Signed)
Dr. Henrene Pastor, I received a call report for this CT abdomen on Dr. Loletha Carrow' patient. As Doc of the Day, I am routing this to you. Please advise.   IMPRESSION: 1. The only questionable abnormality is some prominence of the appendix which measures up to 10 mm in diameter. No other evidence of acute appendicitis is seen but clinical correlation is recommended to exclude developing appendicitis. 2. No renal or ureteral calculi are seen.

## 2015-10-24 ENCOUNTER — Other Ambulatory Visit: Payer: Self-pay

## 2015-10-24 ENCOUNTER — Telehealth: Payer: Self-pay | Admitting: Gastroenterology

## 2015-10-24 DIAGNOSIS — A048 Other specified bacterial intestinal infections: Secondary | ICD-10-CM

## 2015-11-11 ENCOUNTER — Other Ambulatory Visit: Payer: BLUE CROSS/BLUE SHIELD

## 2015-11-11 DIAGNOSIS — A048 Other specified bacterial intestinal infections: Secondary | ICD-10-CM

## 2015-11-13 LAB — H. PYLORI ANTIGEN, STOOL: H pylori Ag, Stl: NEGATIVE

## 2016-02-03 NOTE — Telephone Encounter (Signed)
Done

## 2016-04-25 ENCOUNTER — Other Ambulatory Visit: Payer: Self-pay | Admitting: Interventional Cardiology

## 2016-05-28 ENCOUNTER — Other Ambulatory Visit: Payer: Self-pay | Admitting: Interventional Cardiology

## 2016-06-24 ENCOUNTER — Other Ambulatory Visit: Payer: Self-pay | Admitting: Interventional Cardiology

## 2016-06-28 ENCOUNTER — Other Ambulatory Visit: Payer: Self-pay | Admitting: Interventional Cardiology

## 2016-07-05 ENCOUNTER — Other Ambulatory Visit: Payer: Self-pay | Admitting: Interventional Cardiology

## 2016-07-15 NOTE — Progress Notes (Signed)
Cardiology Office Note    Date:  07/16/2016   ID:  Jeremy Vazquez, DOB 1970-12-02, MRN [id number]  PCP:  Patient, No Pcp Per  Cardiologist: Sinclair Grooms, MD   Chief Complaint  Patient presents with  . Coronary Artery Disease    History of Present Illness:  Jeremy Vazquez is a 46 y.o. male who presents for CAD with recent history of LAD STEMI (DES mid vessel), transient acute systolic heart failure resolved to diastolic heart failure with EF greater than 50%, essential hypertension, and hyperlipidemia.  He is doing well. He denies angina. No nitroglycerin use. Not smoking. Denies orthopnea, PND, lower extremity swelling. Overall feels his heart is doing well.  If he lifts heavy materials he notices chest soreness the next day. He is working all the time without difficulty.   Past Medical History:  Diagnosis Date  . CAD (coronary artery disease)    a. STEMI 05/2014 - s/p DES to LAD.  Marland Kitchen Dyslipidemia   . Hypertension   . Ischemic cardiomyopathy    a. EF 35-40% by cath at time of STEMI 05/2014.    Past Surgical History:  Procedure Laterality Date  . CARDIAC CATHETERIZATION N/A 06/08/2014   Procedure: Left Heart Cath and Coronary Angiography;  Surgeon: Belva Crome, MD;  Location: [facility];  Service: Cardiovascular;  Laterality: N/A;  . CARDIAC CATHETERIZATION N/A 06/08/2014   Procedure: Coronary Stent Intervention;  Surgeon: Belva Crome, MD;  Location: [facility];  Service: Cardiovascular;  Laterality: N/A;    Current Medications: Outpatient Medications Prior to Visit  Medication Sig Dispense Refill  . ASPIRIN LOW DOSE 81 MG EC tablet TAKE 1 TABLET BY MOUTH EVERY DAY 30 tablet 0  . atorvastatin (LIPITOR) 80 MG tablet TAKE 1 TABLET(80 MG) BY MOUTH EVERY EVENING 90 tablet 2  . lisinopril (PRINIVIL,ZESTRIL) 2.5 MG tablet TAKE 1 TABLET(2.5 MG) BY MOUTH DAILY 90 tablet 0  . nitroGLYCERIN (NITROSTAT) 0.4 MG SL tablet Place 1 tablet (0.4 mg total) under  the tongue every 5 (five) minutes as needed for chest pain (up to 3 doses). 25 tablet 1  . metoprolol succinate (TOPROL-XL) 25 MG 24 hr tablet TAKE 1 TABLET(25 MG) BY MOUTH DAILY 30 tablet 0  . bismuth-metronidazole-tetracycline (PYLERA) 140-125-125 MG capsule Take 3 capsules by mouth 4 (four) times daily -  before meals and at bedtime. (Patient not taking: Reported on 07/16/2016) 120 capsule 0  . lisinopril (PRINIVIL,ZESTRIL) 2.5 MG tablet TAKE 1 TABLET(2.5 MG) BY MOUTH DAILY (Patient not taking: Reported on 07/16/2016) 30 tablet 0  . metoprolol succinate (TOPROL-XL) 25 MG 24 hr tablet TAKE 1 TABLET(25 MG) BY MOUTH DAILY (Patient not taking: Reported on 07/16/2016) 90 tablet 0  . omeprazole (PRILOSEC) 20 MG capsule Take 1 capsule (20 mg total) by mouth 2 (two) times daily. (Patient not taking: Reported on 07/16/2016) 20 capsule 0   Facility-Administered Medications Prior to Visit  Medication Dose Route Frequency Provider Last Rate Last Dose  . 0.9 %  sodium chloride infusion  500 mL Intravenous Continuous Danis, Kirke Corin, MD         Allergies:   Patient has no known allergies.   Social History   Social History  . Marital status: Single    Spouse name: N/A  . Number of children: 7  . Years of education: N/A   Occupational History  . [employer]    Social History Main Topics  . Smoking status: Never  Smoker  . Smokeless tobacco: Never Used  . Alcohol use No  . Drug use: No  . Sexual activity: Not Asked   Other Topics Concern  . None   Social History Narrative  . None     Family History:  The patient's family history includes Diabetes type II in his brother, brother, mother, and sister; Hypertension in his brother; Other in his father.   ROS:   Please see the history of present illness.    No other complaints. All other systems reviewed and are negative.   PHYSICAL EXAM:   VS:  BP 140/88 (BP Location: Left Arm)   Pulse (!) 48   Ht 6' (1.829 m)   Wt 150 lb 12.8 oz  (68.4 kg)   BMI 20.45 kg/m    GEN: Well nourished, well developed, in no acute distress  HEENT: normal  Neck: no JVD, carotid bruits, or masses Cardiac: RRR; no murmurs, rubs, or gallops,no edema  Respiratory:  clear to auscultation bilaterally, normal work of breathing GI: soft, nontender, nondistended, + BS MS: no deformity or atrophy  Skin: warm and dry, no rash Neuro:  Alert and Oriented x 3, Strength and sensation are intact Psych: euthymic mood, full affect  Wt Readings from Last 3 Encounters:  07/16/16 150 lb 12.8 oz (68.4 kg)  09/25/15 150 lb (68 kg)  08/21/15 150 lb 4 oz (68.2 kg)      Studies/Labs Reviewed:   EKG:  EKG  Sinus bradycardia  Recent Labs: No results found for requested labs within last 8760 hours.   Lipid Panel    Component Value Date/Time   CHOL 115 (L) 06/24/2015 1242   TRIG 62 06/24/2015 1242   HDL 36 (L) 06/24/2015 1242   CHOLHDL 3.2 06/24/2015 1242   VLDL 12 06/24/2015 1242   LDLCALC 67 06/24/2015 1242    Additional studies/ records that were reviewed today include:  Cardiac catheterization and intervention 2016: Coronary Diagrams   Diagnostic Diagram       Post-Intervention Diagram           ASSESSMENT:    1. Coronary artery disease involving native coronary artery of native heart without angina pectoris   2. 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 Essential hypertension, benign   4. Chronic diastolic heart failure (HCC)   5. Other hyperlipidemia   6. Sinus bradycardia      PLAN:  In order of problems listed above:  1. Asymptomatic. Continue aggressive risk factor modification. 2. Currently inactive. 3. Blood pressure is very well controlled as noted above. 4. No evidence of volume overload. EF greater than 50%. Last lipid panel revealed LDL of 49 when previously done greater than one year ago. We'll repeat a lipid panel today. 5. With significant sinus bradycardia will wean and discontinue low-dose beta  blocker not LV function is back to normal.    Medication Adjustments/Labs and Tests Ordered: Current medicines are reviewed at length with the patient today.  Concerns regarding medicines are outlined above.  Medication changes, Labs and Tests ordered today are listed in the Patient Instructions below. Patient Instructions  Medication Instructions:  1) DECREASE Metoprolol to a half tablet daily for 2 weeks, then discontinue.  Labwork: Lipid and liver today  Testing/Procedures: None  Follow-Up: Your physician wants you to follow-up in: 1 year with Dr. Tamala Julian.  You will receive a reminder letter in the mail two months in advance. If you don't receive a letter, please call our office to schedule  the follow-up appointment.   Any Other Special Instructions Will Be Listed Below (If Applicable).     If you need a refill on your cardiac medications before your next appointment, please call your pharmacy.      Signed, Sinclair Grooms, MD  07/16/2016 8:59 AM    [facility] [address], [place], [place]  [address] Phone: [phone]; Fax: [phone]

## 2016-07-16 ENCOUNTER — Ambulatory Visit (INDEPENDENT_AMBULATORY_CARE_PROVIDER_SITE_OTHER): Payer: BLUE CROSS/BLUE SHIELD | Admitting: Interventional Cardiology

## 2016-07-16 ENCOUNTER — Encounter (INDEPENDENT_AMBULATORY_CARE_PROVIDER_SITE_OTHER): Payer: Self-pay

## 2016-07-16 ENCOUNTER — Encounter: Payer: Self-pay | Admitting: Interventional Cardiology

## 2016-07-16 VITALS — BP 140/88 | HR 48 | Ht 72.0 in | Wt 150.8 lb

## 2016-07-16 DIAGNOSIS — R001 Bradycardia, unspecified: Secondary | ICD-10-CM

## 2016-07-16 DIAGNOSIS — I10 Essential (primary) hypertension: Secondary | ICD-10-CM | POA: Diagnosis not present

## 2016-07-16 DIAGNOSIS — I252 Old myocardial infarction: Secondary | ICD-10-CM | POA: Diagnosis not present

## 2016-07-16 DIAGNOSIS — E784 Other hyperlipidemia: Secondary | ICD-10-CM | POA: Diagnosis not present

## 2016-07-16 DIAGNOSIS — I5032 Chronic diastolic (congestive) heart failure: Secondary | ICD-10-CM | POA: Diagnosis not present

## 2016-07-16 DIAGNOSIS — I2510 Atherosclerotic heart disease of native coronary artery without angina pectoris: Secondary | ICD-10-CM

## 2016-07-16 DIAGNOSIS — E7849 Other hyperlipidemia: Secondary | ICD-10-CM

## 2016-07-16 LAB — HEPATIC FUNCTION PANEL
ALT: 30 IU/L (ref 0–44)
AST: 21 IU/L (ref 0–40)
Albumin: 4.6 g/dL (ref 3.5–5.5)
Alkaline Phosphatase: 71 IU/L (ref 39–117)
Bilirubin Total: 0.4 mg/dL (ref 0.0–1.2)
Bilirubin, Direct: 0.11 mg/dL (ref 0.00–0.40)
Total Protein: 6.7 g/dL (ref 6.0–8.5)

## 2016-07-16 LAB — LIPID PANEL
Chol/HDL Ratio: 2.9 ratio (ref 0.0–5.0)
Cholesterol, Total: 134 mg/dL (ref 100–199)
HDL: 47 mg/dL (ref >39)
LDL Calculated: 65 mg/dL (ref 0–99)
Triglycerides: 110 mg/dL (ref 0–149)
VLDL Cholesterol Cal: 22 mg/dL (ref 5–40)

## 2016-07-16 NOTE — Patient Instructions (Signed)
Medication Instructions:  1) DECREASE Metoprolol to a half tablet daily for 2 weeks, then discontinue.  Labwork: Lipid and liver today  Testing/Procedures: None  Follow-Up: Your physician wants you to follow-up in: 1 year with Dr. Tamala Julian.  You will receive a reminder letter in the mail two months in advance. If you don't receive a letter, please call our office to schedule the follow-up appointment.   Any Other Special Instructions Will Be Listed Below (If Applicable).     If you need a refill on your cardiac medications before your next appointment, please call your pharmacy.

## 2016-08-01 ENCOUNTER — Other Ambulatory Visit: Payer: Self-pay | Admitting: Interventional Cardiology

## 2016-10-01 ENCOUNTER — Other Ambulatory Visit: Payer: Self-pay | Admitting: Interventional Cardiology

## 2016-10-26 IMAGING — CT CT ABD-PELV W/ CM
2 of 5 series · 16 of 46 positions shown, 18 images · IV contrast (iopamidol)
Comparison: CT urogram of 01/24/2004

CLINICAL DATA: Abdominal fullness, early satiety

EXAM:
CT ABDOMEN AND PELVIS WITH CONTRAST
TECHNIQUE: Multidetector CT imaging of the abdomen and pelvis was performed
using the standard protocol following bolus administration of
intravenous contrast.
CONTRAST:  100mL 12QBL3-ERR IOPAMIDOL (12QBL3-ERR) INJECTION 61%

[Series 2: rtn a/p with · axial · 0.74mm/px · z∈[-552,-172]mm · 13 of 90 slices shown, 15 images]
[im 7/90  soft-tissue]
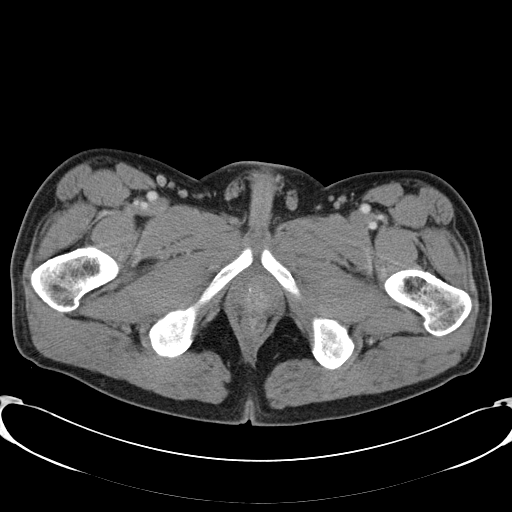
[im 7/90  bone]
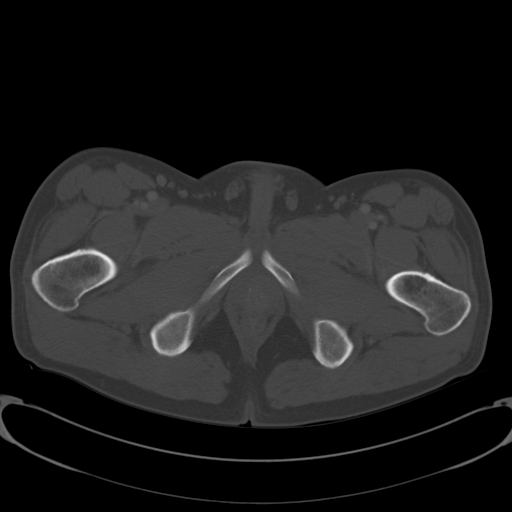
[im 13/90  soft-tissue]
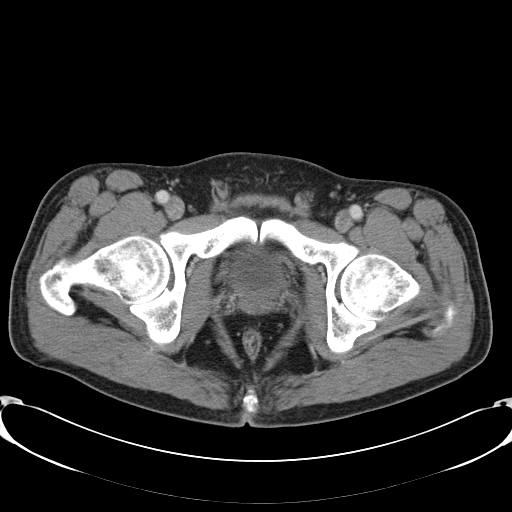
[im 20/90  soft-tissue]
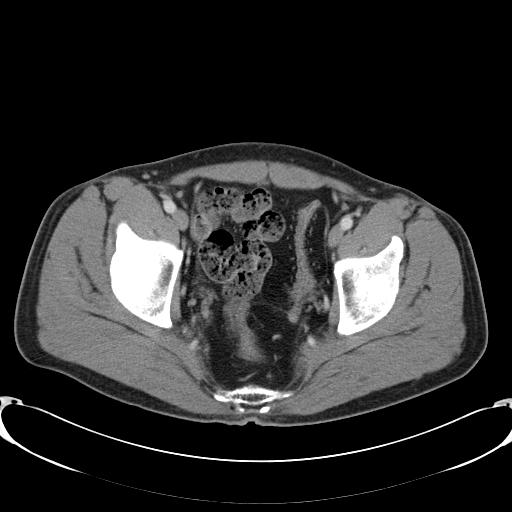
[im 26/90  soft-tissue]
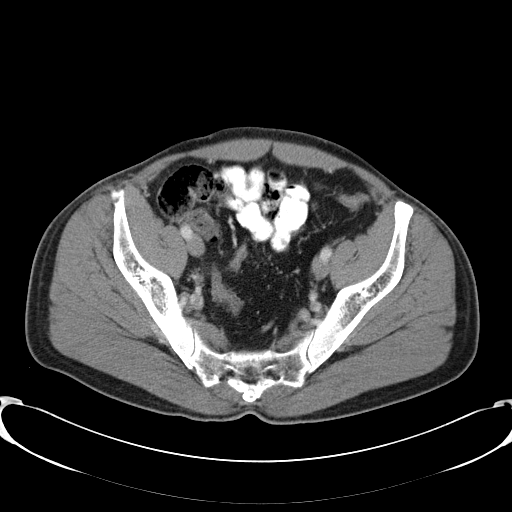
[im 32/90  soft-tissue]
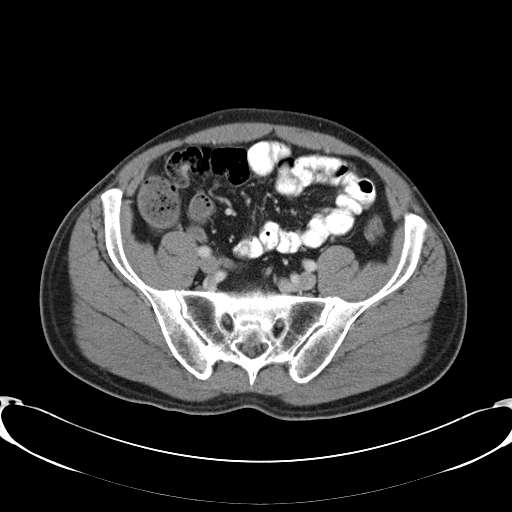
[im 39/90  soft-tissue]
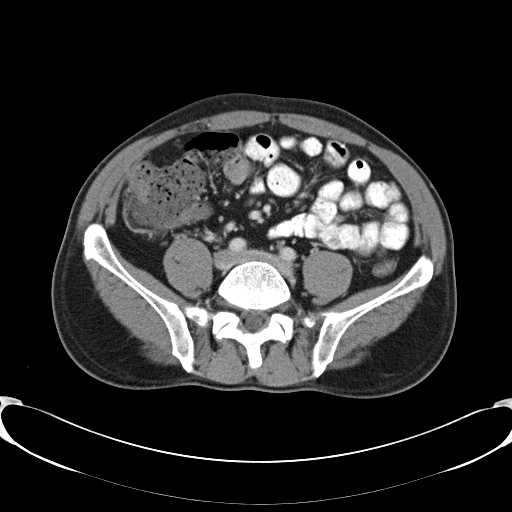
[im 45/90  soft-tissue]
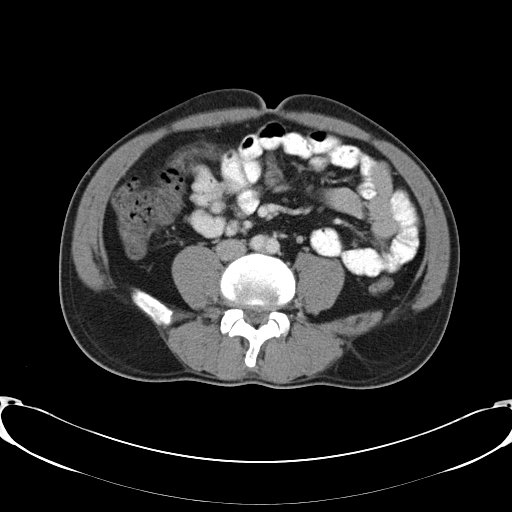
[im 51/90  soft-tissue]
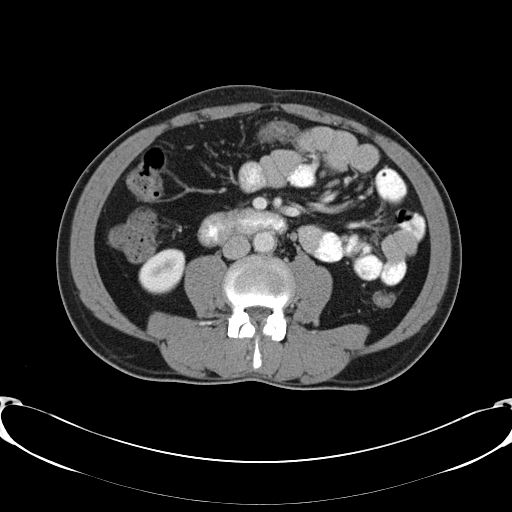
[im 58/90  soft-tissue]
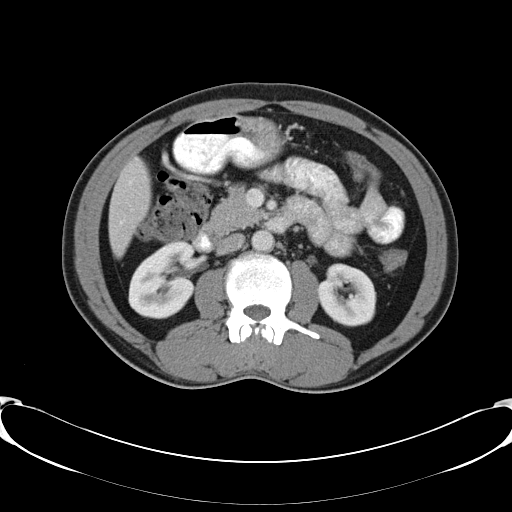
[im 58/90  bone]
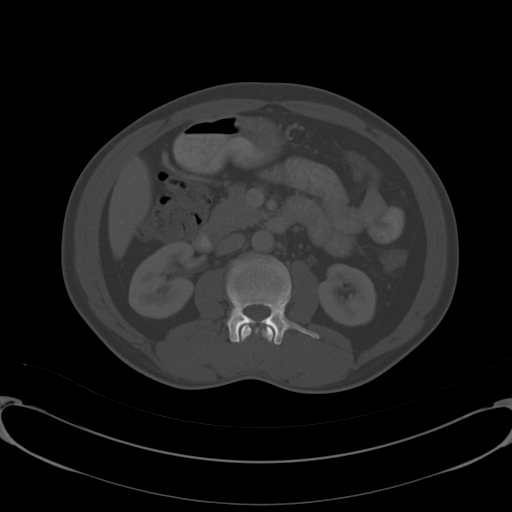
[im 64/90  soft-tissue]
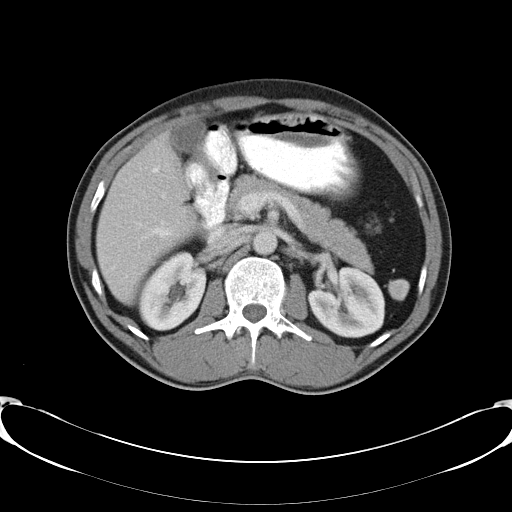
[im 70/90  soft-tissue]
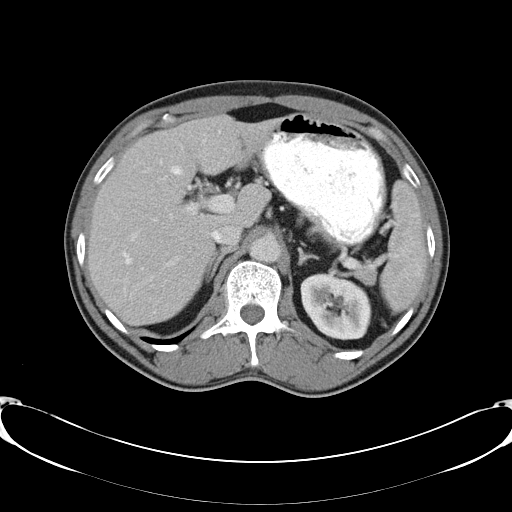
[im 77/90  soft-tissue]
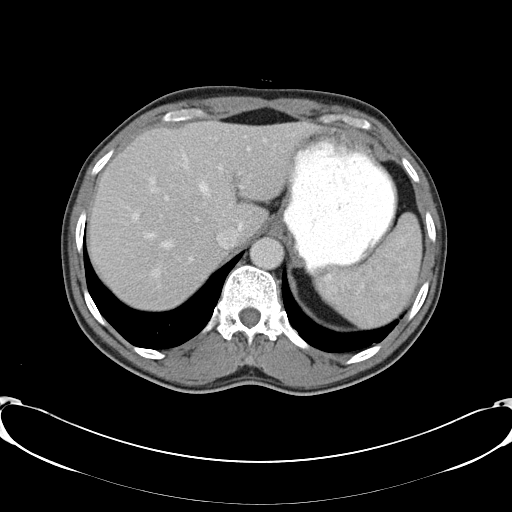
[im 83/90  soft-tissue]
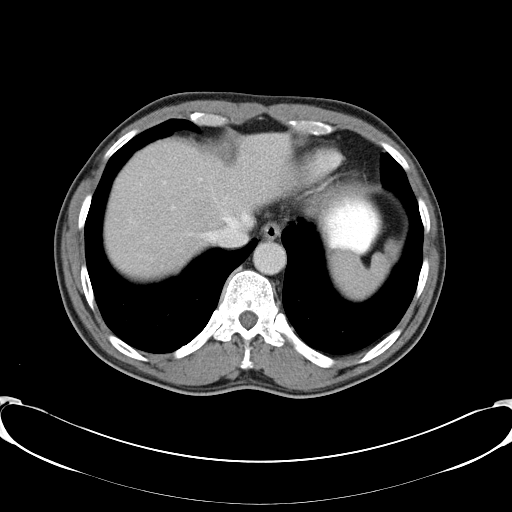

[Series 602: <mpr thick range> · coronal · 0.88mm/px · 3 of 138 slices shown]
[im 46/138  soft-tissue]
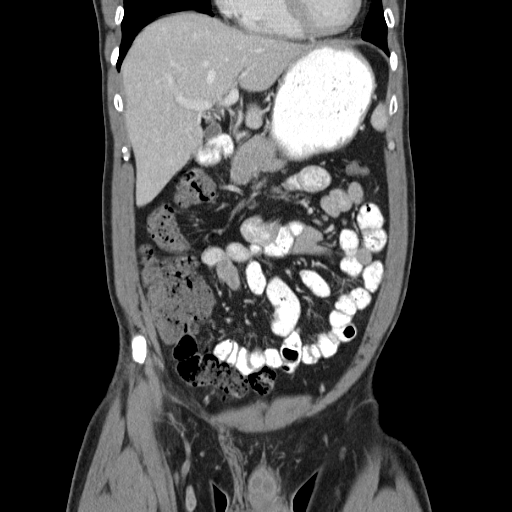
[im 61/138  soft-tissue]
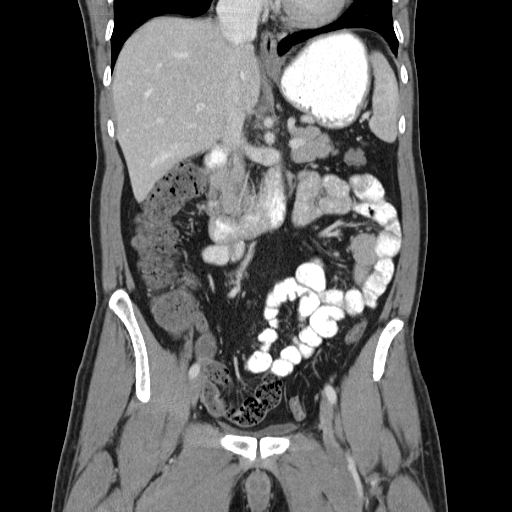
[im 77/138  soft-tissue]
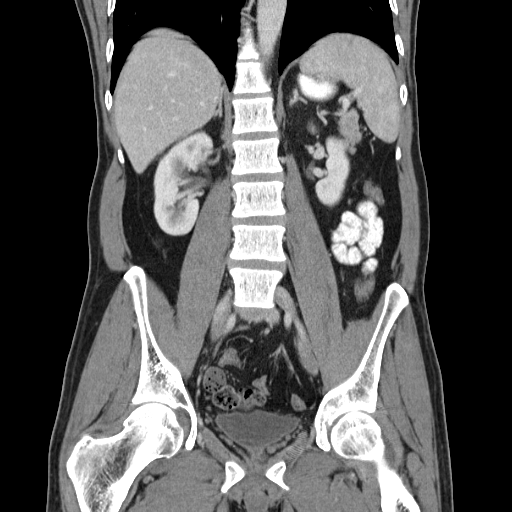

[16 of 46 positions shown; findings below may reference images not displayed]

FINDINGS: Lower chest: The lung bases are clear.

Hepatobiliary: The liver enhances with no focal abnormality and no
ductal dilatation is seen. No calcified gallstones are noted.

Pancreas: The pancreas is normal in size. The pancreatic duct is not
dilated.

Spleen: The spleen is unremarkable.

Adrenals/Urinary Tract: The adrenal glands are symmetrical and
normal. The kidneys enhance with no calculus or mass and on delayed
images, the pelvocaliceal systems are unremarkable. The ureters
appear normal in caliber. The urinary bladder is not well distended
but no abnormality is seen.

Stomach/Bowel: The stomach is moderately well distended with oral
contrast and food debris with no gross abnormality evident. No small
bowel dilatation is seen. The colon is largely decompressed. The
terminal ileum is unremarkable. The appendix is prominent measuring
up to 10 mm in diameter. No periappendiceal strandiness the seen in
no other evidence of acute appendicitis is noted. Clinical
correlation is recommended to exclude developing acute appendicitis.

Vascular/Lymphatic: The abdominal aorta is normal in caliber. No
adenopathy is seen.

Reproductive: The prostate is normal in size.

Other: None

Musculoskeletal: The lumbar vertebrae are in normal alignment with
normal intervertebral disc spaces.
IMPRESSION: 1. The only questionable abnormality is some prominence of the
appendix which measures up to 10 mm in diameter. No other evidence
of acute appendicitis is seen but clinical correlation is
recommended to exclude developing appendicitis.
2. No renal or ureteral calculi are seen.

## 2017-04-15 ENCOUNTER — Other Ambulatory Visit: Payer: Self-pay

## 2017-04-15 ENCOUNTER — Ambulatory Visit: Payer: BLUE CROSS/BLUE SHIELD | Admitting: Physician Assistant

## 2017-04-15 ENCOUNTER — Encounter: Payer: Self-pay | Admitting: Physician Assistant

## 2017-04-15 ENCOUNTER — Other Ambulatory Visit: Payer: Self-pay | Admitting: Physician Assistant

## 2017-04-15 VITALS — BP 155/90 | HR 63 | Temp 97.7°F | Resp 16 | Ht 72.0 in | Wt 156.0 lb

## 2017-04-15 DIAGNOSIS — B350 Tinea barbae and tinea capitis: Secondary | ICD-10-CM

## 2017-04-15 MED ORDER — GRISEOFULVIN MICROSIZE 500 MG PO TABS: 500.0000 mg | ORAL_TABLET | Freq: Every day | ORAL | 0 refills | Status: DC

## 2017-04-15 NOTE — Progress Notes (Signed)
PRIMARY CARE AT [place], [place] [address] [phone]  Date:  04/15/2017   Name:  Jeremy Vazquez   DOB:  Mar 18, 1970   MRN:  [id number]  PCP:  Patient, No Pcp Per    History of Present Illness:  Jeremy Vazquez is a 47 y.o. male patient who presents to PCP with  Chief Complaint  Patient presents with  . Rash    in head/ pt states he has loss hair in that spot/ x 2 months     Patient has noticed a bald spot at the back of his head for the last 2 months.  It has not gotten any bigger.  He does not recall any kind of pressure or irritation at this area.  No pulling of the hair in this area.  He has no itching in this area.  No pain.  He has never had this type of rash before.  No one in his family has this.  He wears a hat every day.  This is new due to the small spot on the back of his head which he is self-conscious about.  Patient Active Problem List   Diagnosis Date Noted  . Chronic diastolic heart failure ([place]) 09/26/2014  . CAD (coronary artery disease), native coronary artery 09/26/2014  . 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 Hyperlipidemia 06/08/2014  . GERD (gastroesophageal reflux disease) 06/29/2013  . Essential hypertension, benign 01/03/2013  . Abdominal fullness 01/03/2013  . Leucopenia 01/03/2013    Past Medical History:  Diagnosis Date  . CAD (coronary artery disease)    a. STEMI 05/2014 - s/p DES to LAD.  Marland Kitchen Dyslipidemia   . Hypertension   . Ischemic cardiomyopathy    a. EF 35-40% by cath at time of STEMI 05/2014.    Past Surgical History:  Procedure Laterality Date  . CARDIAC CATHETERIZATION N/A 06/08/2014   Procedure: Left Heart Cath and Coronary Angiography;  Surgeon: Belva Crome, MD;  Location: [place] [facility];  Service: Cardiovascular;  Laterality: N/A;  . CARDIAC CATHETERIZATION N/A 06/08/2014   Procedure: Coronary Stent Intervention;  Surgeon: Belva Crome, MD;  Location: [place] [facility];  Service:  Cardiovascular;  Laterality: N/A;    Social History   Tobacco Use  . Smoking status: Never Smoker  . Smokeless tobacco: Never Used  Substance Use Topics  . Alcohol use: No  . Drug use: No    Family History  Problem Relation Age of Onset  . Diabetes type II Mother   . Other Father        STOMACH ISSUES  . Diabetes type II Sister   . Diabetes type II Brother   . Hypertension Brother   . Diabetes type II Brother     No Known Allergies  Medication list has been reviewed and updated.  Current Outpatient Medications on File Prior to Visit  Medication Sig Dispense Refill  . ASPIRIN LOW DOSE 81 MG EC tablet TAKE 1 TABLET BY MOUTH EVERY DAY 30 tablet 11  . atorvastatin (LIPITOR) 80 MG tablet TAKE 1 TABLET(80 MG) BY MOUTH EVERY EVENING 90 tablet 2  . lisinopril (PRINIVIL,ZESTRIL) 2.5 MG tablet TAKE 1 TABLET BY MOUTH DAILY 90 tablet 2  . nitroGLYCERIN (NITROSTAT) 0.4 MG SL tablet Place 1 tablet (0.4 mg total) under the tongue every 5 (five) minutes as needed for chest pain (up to 3 doses). (Patient not taking: Reported on 04/15/2017) 25 tablet 1   Current Facility-Administered Medications  on File Prior to Visit  Medication Dose Route Frequency Provider Last Rate Last Dose  . 0.9 %  sodium chloride infusion  500 mL Intravenous Continuous Danis, Estill Cotta III, MD        ROS ROS otherwise unremarkable unless listed above.  Physical Examination: BP (!) 155/90   Pulse 63   Temp 97.7 F (36.5 C) (Oral)   Resp 16   Ht 6' (1.829 m)   Wt 156 lb (70.8 kg)   SpO2 95%   BMI 21.16 kg/m  Ideal Body Weight: Weight in (lb) to have BMI = 25: 183.9  Physical Exam  Constitutional: He is oriented to person, place, and time. He appears well-developed and well-nourished. No distress.  HENT:  Head: Normocephalic and atraumatic.  Eyes: Pupils are equal, round, and reactive to light. Conjunctivae and EOM are normal.  Cardiovascular: Normal rate.  Pulmonary/Chest: Effort normal. No respiratory  distress.  Neurological: He is alert and oriented to person, place, and time.  Skin: Skin is warm and dry. He is not diaphoretic.  Back of the head has a thinned out area about 1 cm in diameter.  This is smooth.  I do not see any short hairs along this.  There is no scaling or erythema.    Psychiatric: He has a normal mood and affect. His behavior is normal.     Assessment and Plan: Jeremy Vazquez is a 47 y.o. male who is here today for cc of  Chief Complaint  Patient presents with  . Rash    in head/ pt states he has loss hair in that spot/ x 2 months  We will do griseofulvin for the next 5 weeks.  He will then follow-up.  I have advised him to also use Selsun Blue at this time.  Precautions given.  Also instructed to eat fatty foods with the griseofulvin Tinea capitis - Plan: griseofulvin (GRIFULVIN V) 500 MG tablet  Ivar Drape, PA-C [facility] 3/28/20199:36 AM

## 2017-04-15 NOTE — Patient Instructions (Addendum)
You can wash your hair with a selsun blue at this time.   Please take medication as prescribed.  Please read below.  The medication is best taken with fatty foods for absorption when you take it, such as peanut butter, ice cream, whole milk etc. Griseofulvin tablets or capsules What is this medicine? GRISEOFULVIN (gri see oh FUL vin) is an antifungal medicine. It is used to treat certain kinds of fungal or yeast infections of the skin, hair, or nails. This medicine may be used for other purposes; ask your health care provider or pharmacist if you have questions. COMMON BRAND NAME(S): Fulvicin P/G, Fulvicin U/F, Grifulvin V, Gris-Peg, Grisactin What should I tell my health care provider before I take this medicine? They need to know if you have any of these conditions: -liver disease -porphyria -systemic lupus erythematosus (SLE) -an unusual or allergic reaction to griseofulvin, penicillin, other foods, dyes or preservatives -pregnant or trying to get pregnant -breast-feeding How should I use this medicine? Take this medicine by mouth with a glass of water. Follow the directions on the prescription label. Take with or without food. Take your medicine at regular intervals. Do not take your medicine more often than directed. Do not skip doses or stop your medicine early even if you feel better. Do not stop taking except on your doctor's advice. Talk to your pediatrician regarding the use of this medicine in children. While this drug may be prescribed for selected conditions, precautions do apply. Overdosage: If you think you have taken too much of this medicine contact a poison control center or emergency room at once. NOTE: This medicine is only for you. Do not share this medicine with others. What if I miss a dose? If you miss a dose, take it as soon as you can. If it is almost time for your next dose, take only that dose. Do not take double or extra doses. What may interact with this  medicine? -aspirin and aspirin-like medicines -barbiturate medicines for sleep or seizures -cyclosporine -male hormones, like estrogens or progestins and birth control pills -warfarin This list may not describe all possible interactions. Give your health care provider a list of all the medicines, herbs, non-prescription drugs, or dietary supplements you use. Also tell them if you smoke, drink alcohol, or use illegal drugs. Some items may interact with your medicine. What should I watch for while using this medicine? Visit your doctor or health care professional for regular check ups. Tell your doctor or health care professional if your symptoms do not improve or if they get worse. Some fungal infections need many weeks or months of treatment to cure. Follow your doctor's instructions on how to care for the infection. You may need to use another medicine on your skin while you are taking this medicine. This medicine can make you more sensitive to the sun. Keep out of the sun. If you cannot avoid being in the sun, wear protective clothing and use sunscreen. Do not use sun lamps or tanning beds/booths. Birth control pills may not work properly while you are taking this medicine. Talk to your doctor about using an extra method of birth control. What side effects may I notice from receiving this medicine? Side effects that you should report to your doctor or health care professional as soon as possible: -allergic reactions like skin rash or hives, swelling of the face, lips, or tongue -confusion -dark urine -fever or infection -loss of appetite -mouth sores, white patches -skin rash, redness, blistering,  or peeling of skin -tingling or numbness in the hands or feet -trouble breathing -unusually weak or tired -yellowing of skin or eyes Side effects that usually do not require medical attention (report to your doctor or health care professional if they continue or are bothersome): -difficulty  sleeping -dizziness -headache -nausea, vomiting -stomach pain This list may not describe all possible side effects. Call your doctor for medical advice about side effects. You may report side effects to FDA at 1-800-FDA-1088. Where should I keep my medicine? Keep out of the reach of children. Store at room temperature between 15 and 30 degrees C (59 and 86 degrees F). Keep container tightly closed. Throw away any unused medicine after the expiration date. NOTE: This sheet is a summary. It may not cover all possible information. If you have questions about this medicine, talk to your doctor, pharmacist, or health care provider.  2018 Elsevier/Gold Standard (2012-06-06 14:42:56)    IF you received an x-ray today, you will receive an invoice from [facility]. Please contact [facility] at [phone] with questions or concerns regarding your invoice.   IF you received labwork today, you will receive an invoice from [place]. Please contact [facility] at [phone] with questions or concerns regarding your invoice.   Our billing staff will not be able to assist you with questions regarding bills from these companies.  You will be contacted with the lab results as soon as they are available. The fastest way to get your results is to activate your My Chart account. Instructions are located on the last page of this paperwork. If you have not heard from [place] regarding the results in 2 weeks, please contact this office.

## 2017-04-15 NOTE — Progress Notes (Unsigned)
PRIMARY CARE AT [place], [place] [address] [phone]  Date:  04/15/2017   Name:  Jeremy Vazquez   DOB:  [birth year]   MRN:  [id number]  PCP:  Patient, No Pcp Per    History of Present Illness:  Jeremy Vazquez is a 47 y.o. male patient who presents to PCP with No chief complaint on file.       Patient Active Problem List   Diagnosis Date Noted  . Chronic diastolic heart failure ([place]) 09/26/2014  . CAD (coronary artery disease), native coronary artery 09/26/2014  . 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 Hyperlipidemia 06/08/2014  . GERD (gastroesophageal reflux disease) 06/29/2013  . Essential hypertension, benign 01/03/2013  . Abdominal fullness 01/03/2013  . Leucopenia 01/03/2013    Past Medical History:  Diagnosis Date  . CAD (coronary artery disease)    a. STEMI 05/2014 - s/p DES to LAD.  Marland Kitchen Dyslipidemia   . Hypertension   . Ischemic cardiomyopathy    a. EF 35-40% by cath at time of STEMI 05/2014.    Past Surgical History:  Procedure Laterality Date  . CARDIAC CATHETERIZATION N/A 06/08/2014   Procedure: Left Heart Cath and Coronary Angiography;  Surgeon: Belva Crome, MD;  Location: [place] [facility];  Service: Cardiovascular;  Laterality: N/A;  . CARDIAC CATHETERIZATION N/A 06/08/2014   Procedure: Coronary Stent Intervention;  Surgeon: Belva Crome, MD;  Location: [facility];  Service: Cardiovascular;  Laterality: N/A;    Social History   Tobacco Use  . Smoking status: Never Smoker  . Smokeless tobacco: Never Used  Substance Use Topics  . Alcohol use: No  . Drug use: No    Family History  Problem Relation Age of Onset  . Diabetes type II Mother   . Other Father        STOMACH ISSUES  . Diabetes type II Sister   . Diabetes type II Brother   . Hypertension Brother   . Diabetes type II Brother     No Known Allergies  Medication list has been reviewed and updated.  Current Outpatient Medications on File  Prior to Visit  Medication Sig Dispense Refill  . ASPIRIN LOW DOSE 81 MG EC tablet TAKE 1 TABLET BY MOUTH EVERY DAY 30 tablet 11  . atorvastatin (LIPITOR) 80 MG tablet TAKE 1 TABLET(80 MG) BY MOUTH EVERY EVENING 90 tablet 2  . lisinopril (PRINIVIL,ZESTRIL) 2.5 MG tablet TAKE 1 TABLET BY MOUTH DAILY 90 tablet 2  . nitroGLYCERIN (NITROSTAT) 0.4 MG SL tablet Place 1 tablet (0.4 mg total) under the tongue every 5 (five) minutes as needed for chest pain (up to 3 doses). (Patient not taking: Reported on 04/15/2017) 25 tablet 1   Current Facility-Administered Medications on File Prior to Visit  Medication Dose Route Frequency Provider Last Rate Last Dose  . 0.9 %  sodium chloride infusion  500 mL Intravenous Continuous Danis, Estill Cotta III, MD        ROS ROS otherwise unremarkable unless listed above.  Physical Examination: There were no vitals taken for this visit. Ideal Body Weight:    Physical Exam   Assessment and Plan: Jeremy Vazquez is a 47 y.o. male who is here today  There are no diagnoses linked to this encounter.  Ivar Drape, PA-C [facility] 04/15/2017 8:30 AM   d

## 2017-04-19 ENCOUNTER — Telehealth: Payer: Self-pay

## 2017-04-19 ENCOUNTER — Encounter: Payer: Self-pay | Admitting: Physician Assistant

## 2017-04-19 NOTE — Telephone Encounter (Signed)
Pt needs new Rx for Grifulvin. Per pharmacy, they do not make this medication anymore. Please call in new rx to [facility] and have pt advised of change.

## 2017-04-20 ENCOUNTER — Other Ambulatory Visit: Payer: Self-pay | Admitting: Physician Assistant

## 2017-04-20 DIAGNOSIS — B350 Tinea barbae and tinea capitis: Secondary | ICD-10-CM

## 2017-04-20 MED ORDER — FLUCONAZOLE 200 MG PO TABS: 400.0000 mg | ORAL_TABLET | ORAL | 0 refills | Status: DC

## 2017-04-20 NOTE — Progress Notes (Unsigned)
We will try the fluconazole weekly.  Follow up in 6 weeks.  If this is not helping, may need to get a hepatic function panel, and try terbinafine--or further testing of bald area.

## 2017-04-20 NOTE — Progress Notes (Signed)
Attempted to call pt. No release. LVM to call back. CRM placed.

## 2017-04-20 NOTE — Telephone Encounter (Signed)
Sent call with order

## 2017-05-26 ENCOUNTER — Ambulatory Visit: Payer: BLUE CROSS/BLUE SHIELD | Admitting: Physician Assistant

## 2017-08-05 ENCOUNTER — Other Ambulatory Visit: Payer: Self-pay | Admitting: Interventional Cardiology

## 2017-08-12 ENCOUNTER — Other Ambulatory Visit: Payer: Self-pay | Admitting: Interventional Cardiology

## 2017-08-16 ENCOUNTER — Telehealth: Payer: Self-pay | Admitting: Interventional Cardiology

## 2017-08-16 MED ORDER — ASPIRIN 81 MG PO TBEC: 81.0000 mg | DELAYED_RELEASE_TABLET | Freq: Every day | ORAL | 3 refills | Status: DC

## 2017-08-16 MED ORDER — LISINOPRIL 2.5 MG PO TABS: 2.5000 mg | ORAL_TABLET | Freq: Every day | ORAL | 3 refills | Status: DC

## 2017-08-16 NOTE — Telephone Encounter (Signed)
Pt's medications were sent to pt's pharmacy as requested. Confirmation received.  

## 2017-08-16 NOTE — Telephone Encounter (Signed)
Ok to refill until November.  Make sure to include note stating must keep appt to get further refills.  Thanks!

## 2017-08-16 NOTE — Telephone Encounter (Signed)
New message     *STAT* If patient is at the pharmacy, call can be transferred to refill team.   1. Which medications need to be refilled? (please list name of each medication and dose if known) ASPIRIN LOW DOSE 81 MG EC tablet and lisinopril (PRINIVIL,ZESTRIL) 2.5 MG tablet  2. Which pharmacy/location (including street and city if local pharmacy) is medication to be sent to? [place], [place] - [address] AT [address]    3. Do they need a 30 day or 90 day supply? [place]

## 2017-08-16 NOTE — Telephone Encounter (Signed)
Pt requesting a refill on aspirin and Lisinopril, LOV was 06/2016. Pt has cancelled 2 appts in august and rescheduled to 11/2017 with Dr. Tamala Julian. Would Dr. Tamala Julian like to refill medications until November. Please address

## 2017-09-03 ENCOUNTER — Ambulatory Visit: Payer: BLUE CROSS/BLUE SHIELD | Admitting: Interventional Cardiology

## 2017-09-06 ENCOUNTER — Ambulatory Visit: Payer: BLUE CROSS/BLUE SHIELD | Admitting: Interventional Cardiology

## 2017-11-21 NOTE — Progress Notes (Signed)
Cardiology Office Note:    Date:  11/22/2017   ID:  PETR BONTEMPO, DOB Feb 06, 1970, MRN [id number]  PCP:  Patient, No Pcp Per  Cardiologist:  Sinclair Grooms, MD   Referring MD: No ref. provider found   Chief Complaint  Patient presents with  . Coronary Artery Disease    History of Present Illness:    TERRIUS GENTILE is a 47 y.o. male with a hx of CAD with recent history of LAD STEMI (DES mid vessel), transient acute systolic heart failure resolved to diastolic heart failure with EF greater than 50%, essential hypertension, and hyperlipidemia.   No chest discomfort, dyspnea, orthopnea, palpitations, or limitations at work.  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  He is quite physically active having to do a fair amount of walking.  He exercises regularly and affirms that he gets more than 150 minutes of moderate activity per week.  He has not needed nitroglycerin.  He wonders why he had myocardial infarction.  Father died in his 20s of liver disease.  Both grandmothers lived to be greater than 66 years of age.  He is not diabetic and is never smoked.  He is compliant with medical regimen.  He has not been careful with salt in his diet.  Past Medical History:  Diagnosis Date  . CAD (coronary artery disease)    a. STEMI 05/2014 - s/p DES to LAD.  Marland Kitchen CAD (coronary artery disease), native coronary artery 09/26/2014   Proximal LAD stent.   . Chronic diastolic heart failure ([place]) 09/26/2014   Postinfarct ischemic cardiomyopathy initial EF during the acute MI 35% recovered to 50-55% post reperfusion. May 2016   . Dyslipidemia   . Essential hypertension, benign 01/03/2013  . GERD (gastroesophageal reflux disease) 06/29/2013  . Hyperlipidemia 06/08/2014  . Hypertension   . Ischemic cardiomyopathy    a. EF 35-40% by cath at time of STEMI 05/2014.  Marland Kitchen Leucopenia 01/03/2013  . 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 Acute  EF 35% recovered to 55% post intervention.     Past Surgical History:  Procedure Laterality Date  . CARDIAC CATHETERIZATION N/A 06/08/2014   Procedure: Left Heart Cath and Coronary Angiography;  Surgeon: Belva Crome, MD;  Location: [facility];  Service: Cardiovascular;  Laterality: N/A;  . CARDIAC CATHETERIZATION N/A 06/08/2014   Procedure: Coronary Stent Intervention;  Surgeon: Belva Crome, MD;  Location: [facility];  Service: Cardiovascular;  Laterality: N/A;    Current Medications: Current Meds  Medication Sig  . aspirin (ASPIRIN LOW DOSE) 81 MG EC tablet Take 1 tablet (81 mg total) by mouth daily. Swallow whole. Please keep upcoming appt before anymore refills. Final attempt  . atorvastatin (LIPITOR) 40 MG tablet Take 1 tablet (40 mg total) by mouth daily.  Marland Kitchen lisinopril (PRINIVIL,ZESTRIL) 5 MG tablet Take 1 tablet (5 mg total) by mouth daily.  . nitroGLYCERIN (NITROSTAT) 0.4 MG SL tablet Place 1 tablet (0.4 mg total) under the tongue every 5 (five) minutes as needed for chest pain (up to 3 doses).  . [DISCONTINUED] atorvastatin (LIPITOR) 40 MG tablet Take 40 mg by mouth daily.  . [DISCONTINUED] lisinopril (PRINIVIL,ZESTRIL) 2.5 MG tablet Take 1 tablet (2.5 mg total) by mouth daily. Please keep upcoming appt in November before anymore refills. Final Attempt   Current Facility-Administered Medications for the 11/22/17 encounter (Office Visit) with Belva Crome, MD  Medication  . 0.9 %  sodium chloride infusion     Allergies:   Patient has no known allergies.   Social History   Socioeconomic History  . Marital status: Single    Spouse name: Not on file  . Number of children: 7  . Years of education: Not on file  . Highest education level: Not on file  Occupational History  . Occupation: [employer]  . Financial resource strain: Not on file  . Food insecurity:    Worry: Not on file    Inability: Not on file  . Transportation needs:     Medical: Not on file    Non-medical: Not on file  Tobacco Use  . Smoking status: Never Smoker  . Smokeless tobacco: Never Used  Substance and Sexual Activity  . Alcohol use: No  . Drug use: No  . Sexual activity: Not on file  Lifestyle  . Physical activity:    Days per week: Not on file    Minutes per session: Not on file  . Stress: Not on file  Relationships  . Social connections:    Talks on phone: Not on file    Gets together: Not on file    Attends religious service: Not on file    Active member of club or organization: Not on file    Attends meetings of clubs or organizations: Not on file    Relationship status: Not on file  Other Topics Concern  . Not on file  Social History Narrative  . Not on file     Family History: The patient's family history includes Diabetes type II in his brother, brother, mother, and sister; Hypertension in his brother; Other in his father.  ROS:   Please see the history of present illness.    Occasional back pain but otherwise no complaints.  All other systems reviewed and are negative.  EKGs/Labs/Other Studies Reviewed:    The following studies were reviewed today:  Catheterization May 2016: Diagnostic Diagram       Post-Intervention Diagram          EKG:  EKG is  ordered today.  The ekg ordered today demonstrates sinus bradycardia, prominent voltage, no significant abnormality other than voltage.  When compared to the prior of 07/16/2016, the heart rate is faster.  Recent Labs: No results found for requested labs within last 8760 hours.  Recent Lipid Panel    Component Value Date/Time   CHOL 134 07/16/2016 0856   TRIG 110 07/16/2016 0856   HDL 47 07/16/2016 0856   CHOLHDL 2.9 07/16/2016 0856   CHOLHDL 3.2 06/24/2015 1242   VLDL 12 06/24/2015 1242   LDLCALC 65 07/16/2016 0856    Physical Exam:    VS:  BP (!) 150/88   Pulse (!) 59   Ht 6' (1.829 m)   Wt 154 lb (69.9 kg)   SpO2 99%   BMI 20.89 kg/m     Wt  Readings from Last 3 Encounters:  11/22/17 154 lb (69.9 kg)  04/15/17 156 lb (70.8 kg)  07/16/16 150 lb 12.8 oz (68.4 kg)     GEN: Slender and physically fit in appearance and in no acute distress HEENT: Normal NECK: No JVD. LYMPHATICS: No lymphadenopathy CARDIAC: RRR, no murmur, no gallop, no edema. VASCULAR: Plus radial, carotid, and femoral pulses.  No bruits. RESPIRATORY:  Clear to auscultation without rales, wheezing or rhonchi  ABDOMEN: Soft, non-tender, non-distended, No pulsatile mass, MUSCULOSKELETAL: No deformity  SKIN:  Warm and dry NEUROLOGIC:  Alert and oriented x 3 PSYCHIATRIC:  Normal affect   ASSESSMENT:    1. Coronary artery disease involving native coronary artery of native heart without angina pectoris   2. Essential hypertension, benign   3. Chronic diastolic heart failure (HCC)   4. Other hyperlipidemia    PLAN:    In order of problems listed above:  1. From cardiac standpoint, he is doing quite well.  Lipid panel will be obtained today. 2. Blood pressure is elevated.  Lisinopril is increased to 5 mg/day.  One month follow-up for blood pressure reevaluation.  May need to have a basic metabolic panel in 1 month at follow-up if today's blood work suggests any issue with kidney function or potassium level. 3. No evidence of volume overload. 4. LDL cholesterol target is less than 70.  Lipid panel will be obtained today.  Encourage secondary prevention:  Overall education and awareness concerning primary/secondary risk prevention was discussed in detail: LDL less than 70, hemoglobin A1c less than 7, blood pressure target less than 130/80 mmHg, >150 minutes of moderate aerobic activity per week, avoidance of smoking, weight control (via diet and exercise), and continued surveillance/management of/for obstructive sleep apnea.    Medication Adjustments/Labs and Tests Ordered: Current medicines are reviewed at length with the patient today.  Concerns regarding  medicines are outlined above.  Orders Placed This Encounter  Procedures  . Comprehensive metabolic panel  . Lipid panel  . EKG 12-Lead   Meds ordered this encounter  Medications  . lisinopril (PRINIVIL,ZESTRIL) 5 MG tablet    Sig: Take 1 tablet (5 mg total) by mouth daily.    Dispense:  90 tablet    Refill:  3  . atorvastatin (LIPITOR) 40 MG tablet    Sig: Take 1 tablet (40 mg total) by mouth daily.    Dispense:  90 tablet    Refill:  3    Patient Instructions  Medication Instructions:  Your physician has recommended you make the following change in your medication:   INCREASE: lisinopril to 5 mg once a day   If you need a refill on your cardiac medications before your next appointment, please call your pharmacy.   Lab work: TODAY: CMET, LIPIDS  At next appointment in 1 month: BMET  If you have labs (blood work) drawn today and your tests are completely normal, you will receive your results only by: Marland Kitchen MyChart Message (if you have MyChart) OR . A paper copy in the mail If you have any lab test that is abnormal or we need to change your treatment, we will call you to review the results.  Testing/Procedures: None ordered  Follow-Up: Your physician recommends that you schedule a follow-up appointment in: 1 month with Dr. Tamala Julian or APP on his team.   At [facility], you and your health needs are our priority.  As part of our continuing mission to provide you with exceptional heart care, we have created designated Provider Care Teams.  These Care Teams include your primary Cardiologist (physician) and Advanced Practice Providers (APPs -  Physician Assistants and Nurse Practitioners) who all work together to provide you with the care you need, when you need it. . You will need a follow up appointment in 1 year.  Please call our office 2 months in advance to schedule this appointment.  You may see Sinclair Grooms, MD or one of the following Advanced Practice Providers on  your designated Care Team:   .  Truitt Merle, NP . Cecilie Kicks, NP . Kathyrn Drown, NP   Any Other Special Instructions Will Be Listed Below (If Applicable).     Signed, Sinclair Grooms, MD  11/22/2017 10:21 AM    [place]

## 2017-11-22 ENCOUNTER — Ambulatory Visit: Payer: BLUE CROSS/BLUE SHIELD | Admitting: Interventional Cardiology

## 2017-11-22 ENCOUNTER — Encounter: Payer: Self-pay | Admitting: Interventional Cardiology

## 2017-11-22 VITALS — BP 150/88 | HR 59 | Ht 72.0 in | Wt 154.0 lb

## 2017-11-22 DIAGNOSIS — I10 Essential (primary) hypertension: Secondary | ICD-10-CM

## 2017-11-22 DIAGNOSIS — I2510 Atherosclerotic heart disease of native coronary artery without angina pectoris: Secondary | ICD-10-CM

## 2017-11-22 DIAGNOSIS — E7849 Other hyperlipidemia: Secondary | ICD-10-CM

## 2017-11-22 DIAGNOSIS — I5032 Chronic diastolic (congestive) heart failure: Secondary | ICD-10-CM | POA: Diagnosis not present

## 2017-11-22 LAB — COMPREHENSIVE METABOLIC PANEL
ALT: 30 IU/L (ref 0–44)
AST: 21 IU/L (ref 0–40)
Albumin/Globulin Ratio: 1.5 (ref 1.2–2.2)
Albumin: 4.6 g/dL (ref 3.5–5.5)
Alkaline Phosphatase: 68 IU/L (ref 39–117)
BUN/Creatinine Ratio: 17 (ref 9–20)
BUN: 17 mg/dL (ref 6–24)
Bilirubin Total: 0.5 mg/dL (ref 0.0–1.2)
CO2: 25 mmol/L (ref 20–29)
Calcium: 9.9 mg/dL (ref 8.7–10.2)
Chloride: 98 mmol/L (ref 96–106)
Creatinine, Ser: 0.98 mg/dL (ref 0.76–1.27)
GFR calc Af Amer: 104 mL/min/{1.73_m2} (ref >59)
GFR calc non Af Amer: 90 mL/min/{1.73_m2} (ref >59)
Globulin, Total: 3 g/dL (ref 1.5–4.5)
Glucose: 83 mg/dL (ref 65–99)
Potassium: 4.1 mmol/L (ref 3.5–5.2)
Sodium: 140 mmol/L (ref 134–144)
Total Protein: 7.6 g/dL (ref 6.0–8.5)

## 2017-11-22 LAB — LIPID PANEL
Chol/HDL Ratio: 3 ratio (ref 0.0–5.0)
Cholesterol, Total: 120 mg/dL (ref 100–199)
HDL: 40 mg/dL (ref >39)
LDL Calculated: 63 mg/dL (ref 0–99)
Triglycerides: 86 mg/dL (ref 0–149)
VLDL Cholesterol Cal: 17 mg/dL (ref 5–40)

## 2017-11-22 MED ORDER — LISINOPRIL 5 MG PO TABS: 5.0000 mg | ORAL_TABLET | Freq: Every day | ORAL | 3 refills | Status: DC

## 2017-11-22 MED ORDER — ATORVASTATIN CALCIUM 40 MG PO TABS: 40.0000 mg | ORAL_TABLET | Freq: Every day | ORAL | 3 refills | Status: DC

## 2017-11-22 NOTE — Patient Instructions (Signed)
Medication Instructions:  Your physician has recommended you make the following change in your medication:   INCREASE: lisinopril to 5 mg once a day   If you need a refill on your cardiac medications before your next appointment, please call your pharmacy.   Lab work: TODAY: CMET, LIPIDS  At next appointment in 1 month: BMET  If you have labs (blood work) drawn today and your tests are completely normal, you will receive your results only by: Marland Kitchen MyChart Message (if you have MyChart) OR . A paper copy in the mail If you have any lab test that is abnormal or we need to change your treatment, we will call you to review the results.  Testing/Procedures: None ordered  Follow-Up: Your physician recommends that you schedule a follow-up appointment in: 1 month with Dr. Tamala Julian or APP on his team.   At [facility], you and your health needs are our priority.  As part of our continuing mission to provide you with exceptional heart care, we have created designated Provider Care Teams.  These Care Teams include your primary Cardiologist (physician) and Advanced Practice Providers (APPs -  Physician Assistants and Nurse Practitioners) who all work together to provide you with the care you need, when you need it. . You will need a follow up appointment in 1 year.  Please call our office 2 months in advance to schedule this appointment.  You may see Sinclair Grooms, MD or one of the following Advanced Practice Providers on your designated Care Team:   . Truitt Merle, NP . Cecilie Kicks, NP . Kathyrn Drown, NP   Any Other Special Instructions Will Be Listed Below (If Applicable).

## 2017-11-22 NOTE — Addendum Note (Signed)
Addended by: Drue Novel I on: 11/22/2017 10:33 AM   Modules accepted: Orders

## 2017-12-21 NOTE — Progress Notes (Signed)
Cardiology Office Note   Date:  12/23/2017   ID:  Jeremy Vazquez, DOB 1970/07/05, MRN [id number]  PCP:  Patient, No Pcp Per  Cardiologist:  Dr. Tamala Julian     Chief Complaint  Patient presents with  . Hypertension      History of Present Illness: Jeremy Vazquez is a 47 y.o. male who presents for CAD, with recent hx of LAD STEMI with stent to mLAD.  transient acute systolic heart failure resolved to diastolic heart failure with EF greater than 50%, essential hypertension, and hyperlipidemia.   On his last visit  His Blood pressure was elevated.  Lisinopril was increased to 5 mg/day.    Today his daughter is with him for appt.  His BP diastolic is elevated, he tells me at work it is 120s / 80s never in the 90s.  He has no chest pain and no SOB.  No problems with his medication.  He is for labs today.  He tells me he feels great.    Past Medical History:  Diagnosis Date  . CAD (coronary artery disease)    a. STEMI 05/2014 - s/p DES to LAD.  Marland Kitchen CAD (coronary artery disease), native coronary artery 09/26/2014   Proximal LAD stent.   . Chronic diastolic heart failure ([place]) 09/26/2014   Postinfarct ischemic cardiomyopathy initial EF during the acute MI 35% recovered to 50-55% post reperfusion. May 2016   . Dyslipidemia   . Essential hypertension, benign 01/03/2013  . GERD (gastroesophageal reflux disease) 06/29/2013  . Hyperlipidemia 06/08/2014  . Hypertension   . Ischemic cardiomyopathy    a. EF 35-40% by cath at time of STEMI 05/2014.  Marland Kitchen Leucopenia 01/03/2013  . 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 Acute EF 35% recovered to 55% post intervention.     Past Surgical History:  Procedure Laterality Date  . CARDIAC CATHETERIZATION N/A 06/08/2014   Procedure: Left Heart Cath and Coronary Angiography;  Surgeon: Belva Crome, MD;  Location: [facility];  Service: Cardiovascular;  Laterality: N/A;  . CARDIAC CATHETERIZATION N/A 06/08/2014   Procedure:  Coronary Stent Intervention;  Surgeon: Belva Crome, MD;  Location: [facility];  Service: Cardiovascular;  Laterality: N/A;     Current Outpatient Medications  Medication Sig Dispense Refill  . aspirin (ASPIRIN LOW DOSE) 81 MG EC tablet Take 1 tablet (81 mg total) by mouth daily. Swallow whole. Please keep upcoming appt before anymore refills. Final attempt 30 tablet 3  . atorvastatin (LIPITOR) 40 MG tablet Take 1 tablet (40 mg total) by mouth daily. 90 tablet 3  . lisinopril (PRINIVIL,ZESTRIL) 5 MG tablet Take 1 tablet (5 mg total) by mouth daily. 90 tablet 3  . nitroGLYCERIN (NITROSTAT) 0.4 MG SL tablet Place 1 tablet (0.4 mg total) under the tongue every 5 (five) minutes as needed for chest pain (up to 3 doses). 25 tablet 1   Current Facility-Administered Medications  Medication Dose Route Frequency Provider Last Rate Last Dose  . 0.9 %  sodium chloride infusion  500 mL Intravenous Continuous Danis, Kirke Corin, MD        Allergies:   Patient has no known allergies.    Social History:  The patient  reports that he has never smoked. He has never used smokeless tobacco. He reports that he does not drink alcohol or use drugs.   Family History:  The patient's family history includes Diabetes type II in his brother, brother,  mother, and sister; Hypertension in his brother; Other in his father.    ROS:  General:no colds or fevers, no weight changes Skin:no rashes or ulcers HEENT:no blurred vision, no congestion CV:see HPI PUL:see HPI GI:no diarrhea constipation or melena, no indigestion GU:no hematuria, no dysuria MS:no joint pain, no claudication Neuro:no syncope, no lightheadedness Endo:no diabetes, no thyroid disease  Wt Readings from Last 3 Encounters:  12/23/17 155 lb (70.3 kg)  11/22/17 154 lb (69.9 kg)  04/15/17 156 lb (70.8 kg)     PHYSICAL EXAM: VS:  BP (!) 130/94   Pulse (!) 59   Ht 6' (1.829 m)   Wt 155 lb (70.3 kg)   SpO2 99%   BMI 21.02 kg/m  , BMI  Body mass index is 21.02 kg/m. General:Pleasant affect, NAD Skin:Warm and dry, brisk capillary refill HEENT:normocephalic, sclera clear, mucus membranes moist Neck:supple, no JVD, no bruits  Heart:S1S2 RRR without murmur, gallup, rub or click Lungs:clear without rales, rhonchi, or wheezes DJM:EQAS, non tender, + BS, do not palpate liver spleen or masses Ext:no lower ext edema, 2+ pedal pulses, 2+ radial pulses Neuro:alert and oriented X 3, MAE, follows commands, + facial symmetry    EKG:  EKG is NOT ordered today.   Recent Labs: 11/22/2017: ALT 30; BUN 17; Creatinine, Ser 0.98; Potassium 4.1; Sodium 140    Lipid Panel    Component Value Date/Time   CHOL 120 11/22/2017 1017   TRIG 86 11/22/2017 1017   HDL 40 11/22/2017 1017   CHOLHDL 3.0 11/22/2017 1017   CHOLHDL 3.2 06/24/2015 1242   VLDL 12 06/24/2015 1242   LDLCALC 63 11/22/2017 1017       Other studies Reviewed: Additional studies/ records that were reviewed today include: . Cardiac cath 06/08/14   Prox thrombus containing LAD lesion, 99% stenosed. There is a 0% residual stenosis post intervention.  A drug-eluting stent was placed.    Acute coronary syndrome due 99% thrombotic proximal to mid LAD.  Successful DES implantation with reduction in stenosis from 99% to 0% with TIMI grade 3 flow.  Normal circumflex and right coronary arteries  Left ventricular dysfunction, acute systolic, with mid to distal and apical anterior wall akinesis. Estimated ejection fraction 35-40%. I anticipate that this will recover to normal.   RECOMMENDATIONS:  Dual antiplatelet therapy for 12 months  Low-dose beta blocker and ACE inhibitor therapy is started but will be limited by blood pressure initially  High intensity statin therapy  Patient will be fast track and could potentially be discharged from the hospital between 48 and 72 hours if no complications  Early ambulation this a.m. with transferred to telemetry in 12-18  hours if stable.  Early cardiac rehabilitation  Echo 08/15/14 Study Conclusions  - Left ventricle: The cavity size was normal. Wall thickness was   normal. Systolic function was normal. The estimated ejection   fraction was in the range of 55% to 60%. Wall motion was normal;   there were no regional wall motion abnormalities. Left   ventricular diastolic function parameters were normal. - Aortic valve: There was no stenosis. - Mitral valve: There was trivial regurgitation. - Right ventricle: The cavity size was normal. Systolic function   was normal. - Tricuspid valve: Peak RV-RA gradient (S): 25 mm Hg. - Pulmonary arteries: PA peak pressure: 28 mm Hg (S). - Inferior vena cava: The vessel was normal in size. The   respirophasic diameter changes were in the normal range (>= 50%),   consistent with normal central  venous pressure.  Impressions:  - Normal LV size and systolic function, EF 61-51%. I did not see   any regional wall motion abnormalities. Normal RV size and   systolic function. No significant valvular abnormalities.  ASSESSMENT AND PLAN:  1.  HTN improved with increase of ACE will continue at this dose.  For BMP today.  Pt will call if BP > 130/90 consistently otherwise follow up with Dr Tamala Julian   2.  CAD stable with no chest pain no no need for NTG continue ASA     Current medicines are reviewed with the patient today.  The patient Has no concerns regarding medicines.  The following changes have been made:  See above Labs/ tests ordered today include:see above  Disposition:   FU:  see above  Signed, Cecilie Kicks, NP  12/23/2017 8:58 AM    [place], [place], [place], [place] Phone: [phone]; Fax: [phone]

## 2017-12-23 ENCOUNTER — Encounter: Payer: Self-pay | Admitting: Cardiology

## 2017-12-23 ENCOUNTER — Encounter (INDEPENDENT_AMBULATORY_CARE_PROVIDER_SITE_OTHER): Payer: Self-pay

## 2017-12-23 ENCOUNTER — Ambulatory Visit (INDEPENDENT_AMBULATORY_CARE_PROVIDER_SITE_OTHER): Payer: BLUE CROSS/BLUE SHIELD | Admitting: Cardiology

## 2017-12-23 ENCOUNTER — Other Ambulatory Visit: Payer: BLUE CROSS/BLUE SHIELD | Admitting: *Deleted

## 2017-12-23 VITALS — BP 130/94 | HR 59 | Ht 72.0 in | Wt 155.0 lb

## 2017-12-23 DIAGNOSIS — I10 Essential (primary) hypertension: Secondary | ICD-10-CM

## 2017-12-23 DIAGNOSIS — I5032 Chronic diastolic (congestive) heart failure: Secondary | ICD-10-CM | POA: Diagnosis not present

## 2017-12-23 DIAGNOSIS — I2510 Atherosclerotic heart disease of native coronary artery without angina pectoris: Secondary | ICD-10-CM

## 2017-12-23 DIAGNOSIS — E7849 Other hyperlipidemia: Secondary | ICD-10-CM

## 2017-12-23 LAB — BASIC METABOLIC PANEL
BUN/Creatinine Ratio: 22 — ABNORMAL HIGH (ref 9–20)
BUN: 20 mg/dL (ref 6–24)
CO2: 26 mmol/L (ref 20–29)
Calcium: 10 mg/dL (ref 8.7–10.2)
Chloride: 99 mmol/L (ref 96–106)
Creatinine, Ser: 0.91 mg/dL (ref 0.76–1.27)
GFR calc Af Amer: 114 mL/min/{1.73_m2} (ref >59)
GFR calc non Af Amer: 99 mL/min/{1.73_m2} (ref >59)
Glucose: 93 mg/dL (ref 65–99)
Potassium: 4.3 mmol/L (ref 3.5–5.2)
Sodium: 141 mmol/L (ref 134–144)

## 2017-12-23 NOTE — Patient Instructions (Signed)
Medication Instructions:  Your physician recommends that you continue on your current medications as directed. Please refer to the Current Medication list given to you today.  If you need a refill on your cardiac medications before your next appointment, please call your pharmacy.   Lab work: TODAY:  PREVIOUSLY ORDERED BMET  If you have labs (blood work) drawn today and your tests are completely normal, you will receive your results only by: Marland Kitchen MyChart Message (if you have MyChart) OR . A paper copy in the mail If you have any lab test that is abnormal or we need to change your treatment, we will call you to review the results.  Testing/Procedures: None ordered  Follow-Up: At [facility], you and your health needs are our priority.  As part of our continuing mission to provide you with exceptional heart care, we have created designated Provider Care Teams.  These Care Teams include your primary Cardiologist (physician) and Advanced Practice Providers (APPs -  Physician Assistants and Nurse Practitioners) who all work together to provide you with the care you need, when you need it. You will need a follow up appointment in 1 years.  Please call our office 2 months in advance to schedule this appointment.  You may see Sinclair Grooms, MD or one of the following Advanced Practice Providers on your designated Care Team:   Truitt Merle, NP Cecilie Kicks, NP . Kathyrn Drown, NP  Any Other Special Instructions Will Be Listed Below (If Applicable). The target Blood Pressuer is between 120-130 on top and between 80-90 on the bottom

## 2018-03-15 ENCOUNTER — Other Ambulatory Visit: Payer: Self-pay | Admitting: Interventional Cardiology

## 2018-03-18 ENCOUNTER — Telehealth: Payer: Self-pay | Admitting: Interventional Cardiology

## 2018-03-18 MED ORDER — ASPIRIN 81 MG PO TBEC: 81.0000 mg | DELAYED_RELEASE_TABLET | Freq: Every day | ORAL | 3 refills | Status: DC

## 2018-03-18 MED ORDER — LISINOPRIL 5 MG PO TABS: 5.0000 mg | ORAL_TABLET | Freq: Every day | ORAL | 3 refills | Status: DC

## 2018-03-18 NOTE — Telephone Encounter (Signed)
New Message    *STAT* If patient is at the pharmacy, call can be transferred to refill team.   1. Which medications need to be refilled? (please list name of each medication and dose if known) Aspirin 81mg  and Lisinopril 5Mg   2. Which pharmacy/location (including street and city if local pharmacy) is medication to be sent to? [facility] on [address]   3. Do they need a 30 day or 90 day supply? 90 day supply

## 2018-03-18 NOTE — Telephone Encounter (Signed)
I will route to [employer] RMA as Juluis Rainier as this looks like the operator routed to Refill Dept.

## 2018-07-25 ENCOUNTER — Other Ambulatory Visit: Payer: Self-pay

## 2018-07-25 ENCOUNTER — Encounter (HOSPITAL_COMMUNITY): Payer: Self-pay

## 2018-07-25 ENCOUNTER — Emergency Department (HOSPITAL_COMMUNITY)
Admission: EM | Admit: 2018-07-25 | Discharge: 2018-07-26 | Disposition: A | Discharge status: Home / Self Care | Payer: BC Managed Care – PPO | Attending: Emergency Medicine | Admitting: Emergency Medicine

## 2018-07-25 ENCOUNTER — Emergency Department (HOSPITAL_COMMUNITY): Payer: BC Managed Care – PPO

## 2018-07-25 DIAGNOSIS — I110 Hypertensive heart disease with heart failure: Secondary | ICD-10-CM | POA: Insufficient documentation

## 2018-07-25 DIAGNOSIS — Z79899 Other long term (current) drug therapy: Secondary | ICD-10-CM | POA: Insufficient documentation

## 2018-07-25 DIAGNOSIS — I5032 Chronic diastolic (congestive) heart failure: Secondary | ICD-10-CM | POA: Insufficient documentation

## 2018-07-25 DIAGNOSIS — Z7982 Long term (current) use of aspirin: Secondary | ICD-10-CM | POA: Insufficient documentation

## 2018-07-25 DIAGNOSIS — R109 Unspecified abdominal pain: Secondary | ICD-10-CM | POA: Diagnosis not present

## 2018-07-25 DIAGNOSIS — N200 Calculus of kidney: Secondary | ICD-10-CM | POA: Diagnosis not present

## 2018-07-25 DIAGNOSIS — I2510 Atherosclerotic heart disease of native coronary artery without angina pectoris: Secondary | ICD-10-CM | POA: Diagnosis not present

## 2018-07-25 LAB — URINALYSIS, ROUTINE W REFLEX MICROSCOPIC
Bilirubin Urine: NEGATIVE
Glucose, UA: NEGATIVE mg/dL
Hgb urine dipstick: NEGATIVE
Ketones, ur: NEGATIVE mg/dL
Leukocytes,Ua: NEGATIVE
Nitrite: NEGATIVE
Protein, ur: NEGATIVE mg/dL
Specific Gravity, Urine: 1.023 (ref 1.005–1.030)
pH: 7 (ref 5.0–8.0)

## 2018-07-25 LAB — CBC
HCT: 50.4 % (ref 39.0–52.0)
Hemoglobin: 17 g/dL (ref 13.0–17.0)
MCH: 30.3 pg (ref 26.0–34.0)
MCHC: 33.7 g/dL (ref 30.0–36.0)
MCV: 89.8 fL (ref 80.0–100.0)
Platelets: 228 10*3/uL (ref 150–400)
RBC: 5.61 MIL/uL (ref 4.22–5.81)
RDW: 12.1 % (ref 11.5–15.5)
WBC: 3.9 10*3/uL — ABNORMAL LOW (ref 4.0–10.5)
nRBC: 0 % (ref 0.0–0.2)

## 2018-07-25 LAB — COMPREHENSIVE METABOLIC PANEL
ALT: 31 U/L (ref 0–44)
AST: 22 U/L (ref 15–41)
Albumin: 4.3 g/dL (ref 3.5–5.0)
Alkaline Phosphatase: 64 U/L (ref 38–126)
Anion gap: 8 (ref 5–15)
BUN: 19 mg/dL (ref 6–20)
CO2: 27 mmol/L (ref 22–32)
Calcium: 9.5 mg/dL (ref 8.9–10.3)
Chloride: 106 mmol/L (ref 98–111)
Creatinine, Ser: 1.42 mg/dL — ABNORMAL HIGH (ref 0.61–1.24)
GFR calc Af Amer: >60 mL/min (ref >60)
GFR calc non Af Amer: 57 mL/min — ABNORMAL LOW (ref >60)
Glucose, Bld: 103 mg/dL — ABNORMAL HIGH (ref 70–99)
Potassium: 4.1 mmol/L (ref 3.5–5.1)
Sodium: 141 mmol/L (ref 135–145)
Total Bilirubin: 0.9 mg/dL (ref 0.3–1.2)
Total Protein: 7.3 g/dL (ref 6.5–8.1)

## 2018-07-25 LAB — LIPASE, BLOOD: Lipase: 27 U/L (ref 11–51)

## 2018-07-25 MED ORDER — SODIUM CHLORIDE 0.9% FLUSH: 3.0000 mL | Freq: Once | INTRAVENOUS | Status: DC

## 2018-07-25 MED ORDER — OXYCODONE-ACETAMINOPHEN 5-325 MG PO TABS
1.0000 | ORAL_TABLET | Freq: Once | ORAL | Status: AC
  Administered 2018-07-26: 1 via ORAL
  Filled 2018-07-25: qty 1

## 2018-07-25 NOTE — ED Triage Notes (Signed)
Previous triage note charted by this nurse, Rainey Pines

## 2018-07-25 NOTE — ED Triage Notes (Signed)
Patient arrived to ED via POV with c/o LLQ ABD pain x 1hour. Also pain in perineal area and states that he has not been urinating like normal.

## 2018-07-26 MED ORDER — TAMSULOSIN HCL 0.4 MG PO CAPS: 0.4000 mg | ORAL_CAPSULE | Freq: Every day | ORAL | 0 refills | Status: DC

## 2018-07-26 MED ORDER — ONDANSETRON 4 MG PO TBDP: 4.0000 mg | ORAL_TABLET | Freq: Three times a day (TID) | ORAL | 0 refills | Status: DC | PRN

## 2018-07-26 MED ORDER — KETOROLAC TROMETHAMINE 15 MG/ML IJ SOLN
15.0000 mg | Freq: Once | INTRAMUSCULAR | Status: DC
  Filled 2018-07-26: qty 1

## 2018-07-26 MED ORDER — OXYCODONE-ACETAMINOPHEN 5-325 MG PO TABS: 1.0000 | ORAL_TABLET | Freq: Four times a day (QID) | ORAL | 0 refills | Status: DC | PRN

## 2018-07-26 NOTE — ED Notes (Signed)
Patient verbalizes understanding of discharge instructions. Opportunity for questioning and answers were provided. Armband removed by staff, pt discharged from ED.  

## 2018-07-26 NOTE — ED Provider Notes (Signed)
[facility] EMERGENCY DEPARTMENT Provider Note   CSN: [id number] Arrival date & time: 07/25/18  1641    History   Chief Complaint Chief Complaint  Patient presents with  . Abdominal Pain    HPI Jeremy Vazquez is a 48 y.o. male.     HPI  This is a 48 year old male with a history of hypertension, hyperlipidemia, kidney stones, coronary artery disease who presents with groin pain and left side pain.  Patient reports over the last several days he has noted pain in his perineum and groin which seems to be relieved somewhat with urination.  He states that he has urgency and some dysuria.  Denies any fevers.  Today he had acute onset of left-sided flank pain.  It lasted 30 to 40 minutes and then resolved.  He is currently pain-free.  He denies any fevers or sick contacts.  He denies any nausea, vomiting, diarrhea.  Past Medical History:  Diagnosis Date  . CAD (coronary artery disease)    a. STEMI 05/2014 - s/p DES to LAD.  Marland Kitchen CAD (coronary artery disease), native coronary artery 09/26/2014   Proximal LAD stent.   . Chronic diastolic heart failure ([place]) 09/26/2014   Postinfarct ischemic cardiomyopathy initial EF during the acute MI 35% recovered to 50-55% post reperfusion. May 2016   . Dyslipidemia   . Essential hypertension, benign 01/03/2013  . GERD (gastroesophageal reflux disease) 06/29/2013  . Hyperlipidemia 06/08/2014  . Hypertension   . Ischemic cardiomyopathy    a. EF 35-40% by cath at time of STEMI 05/2014.  Marland Kitchen Kidney calculi 2017  . Leucopenia 01/03/2013  . 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 Acute EF 35% recovered to 55% post intervention.     Patient Active Problem List   Diagnosis Date Noted  . Chronic diastolic heart failure ([place]) 09/26/2014  . CAD (coronary artery disease), native coronary artery 09/26/2014  . 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 Hyperlipidemia 06/08/2014  . GERD (gastroesophageal  reflux disease) 06/29/2013  . Essential hypertension, benign 01/03/2013  . Abdominal fullness 01/03/2013  . Leucopenia 01/03/2013    Past Surgical History:  Procedure Laterality Date  . CARDIAC CATHETERIZATION N/A 06/08/2014   Procedure: Left Heart Cath and Coronary Angiography;  Surgeon: Belva Crome, MD;  Location: [facility];  Service: Cardiovascular;  Laterality: N/A;  . CARDIAC CATHETERIZATION N/A 06/08/2014   Procedure: Coronary Stent Intervention;  Surgeon: Belva Crome, MD;  Location: [facility];  Service: Cardiovascular;  Laterality: N/A;        Home Medications    Prior to Admission medications   Medication Sig Start Date End Date Taking? Authorizing Provider  aspirin (ASPIRIN LOW DOSE) 81 MG EC tablet Take 1 tablet (81 mg total) by mouth daily. Swallow whole. 03/18/18   Isaiah Serge, NP  atorvastatin (LIPITOR) 40 MG tablet Take 1 tablet (40 mg total) by mouth daily. 11/22/17   Belva Crome, MD  lisinopril (PRINIVIL,ZESTRIL) 5 MG tablet Take 1 tablet (5 mg total) by mouth daily. 03/18/18   Isaiah Serge, NP  nitroGLYCERIN (NITROSTAT) 0.4 MG SL tablet Place 1 tablet (0.4 mg total) under the tongue every 5 (five) minutes as needed for chest pain (up to 3 doses). 10/07/15   Belva Crome, MD  ondansetron (ZOFRAN ODT) 4 MG disintegrating tablet Take 1 tablet (4 mg total) by mouth every 8 (eight) hours as needed for nausea or vomiting. 07/26/18  Horton, Barbette Hair, MD  oxyCODONE-acetaminophen (PERCOCET/ROXICET) 5-325 MG tablet Take 1 tablet by mouth every 6 (six) hours as needed for severe pain. 07/26/18   Horton, Barbette Hair, MD  tamsulosin (FLOMAX) 0.4 MG CAPS capsule Take 1 capsule (0.4 mg total) by mouth daily. 07/26/18   Horton, Barbette Hair, MD    Family History Family History  Problem Relation Age of Onset  . Diabetes type II Mother   . Other Father        STOMACH ISSUES  . Diabetes type II Sister   . Diabetes type II Brother   . Hypertension Brother   .  Diabetes type II Brother     Social History Social History   Tobacco Use  . Smoking status: Never Smoker  . Smokeless tobacco: Never Used  Substance Use Topics  . Alcohol use: No  . Drug use: No     Allergies   Patient has no known allergies.   Review of Systems Review of Systems  Constitutional: Negative for fever.  Respiratory: Negative for shortness of breath.   Cardiovascular: Negative for chest pain.  Gastrointestinal: Negative for abdominal pain, nausea and vomiting.  Genitourinary: Positive for flank pain and urgency. Negative for scrotal swelling.  All other systems reviewed and are negative.    Physical Exam Updated Vital Signs BP 137/82   Pulse (!) 57   Temp 98.3 F (36.8 C) (Oral)   Resp 19   Ht 1.829 m (6')   Wt 70.3 kg   SpO2 99%   BMI 21.02 kg/m   Physical Exam Vitals signs and nursing note reviewed.  Constitutional:      Appearance: He is well-developed. He is not ill-appearing.  HENT:     Head: Normocephalic and atraumatic.  Eyes:     Pupils: Pupils are equal, round, and reactive to light.  Neck:     Musculoskeletal: Neck supple.  Cardiovascular:     Rate and Rhythm: Normal rate and regular rhythm.     Heart sounds: Normal heart sounds. No murmur.  Pulmonary:     Effort: Pulmonary effort is normal. No respiratory distress.     Breath sounds: Normal breath sounds. No wheezing.  Abdominal:     General: Bowel sounds are normal.     Palpations: Abdomen is soft.     Tenderness: There is no abdominal tenderness. There is no right CVA tenderness, left CVA tenderness, guarding or rebound.  Genitourinary:    Scrotum/Testes: Normal.     Comments: Normal testicular lie, intact cremasteric reflex, no tenderness or mass noted Lymphadenopathy:     Cervical: No cervical adenopathy.  Skin:    General: Skin is warm and dry.  Neurological:     Mental Status: He is alert and oriented to person, place, and time.  Psychiatric:        Mood and  Affect: Mood normal.      ED Treatments / Results  Labs (all labs ordered are listed, but only abnormal results are displayed) Labs Reviewed  COMPREHENSIVE METABOLIC PANEL - Abnormal; Notable for the following components:      Result Value   Glucose, Bld 103 (*)    Creatinine, Ser 1.42 (*)    GFR calc non Af Amer 57 (*)    All other components within normal limits  CBC - Abnormal; Notable for the following components:   WBC 3.9 (*)    All other components within normal limits  URINALYSIS, ROUTINE W REFLEX MICROSCOPIC - Abnormal; Notable for the  following components:   APPearance HAZY (*)    All other components within normal limits  LIPASE, BLOOD    EKG None  Radiology Ct Renal Stone Study  Result Date: 07/26/2018 CLINICAL DATA:  Initial evaluation for acute left lower quadrant abdominal pain. EXAM: CT ABDOMEN AND PELVIS WITHOUT CONTRAST TECHNIQUE: Multidetector CT imaging of the abdomen and pelvis was performed following the standard protocol without IV contrast. COMPARISON:  Prior CT from 10/23/2015. FINDINGS: Lower chest: Visualized lung bases are clear. Hepatobiliary: Limited noncontrast evaluation of the liver is unremarkable. Gallbladder within normal limits. No biliary dilatation. Pancreas: Pancreas within normal limits. Spleen: Spleen within normal limits. Adrenals/Urinary Tract: Adrenal glands are normal. Kidneys equal in size without evidence for nephrolithiasis or hydronephrosis. There is a punctate 3 mm stone at the left UVJ with secondary minimal asymmetric fullness of the left ureter. No other radiopaque calculi seen along the course of either renal collecting system. Partially distended bladder within normal limits. No layering stones within the bladder lumen. Stomach/Bowel: Stomach largely decompressed without acute finding. No evidence for bowel obstruction. No acute inflammatory changes seen about the bowels. Normal appendix. Vascular/Lymphatic: Intra-abdominal aorta of  normal caliber. Minimal plaque about the aortic bifurcation. No adenopathy. Reproductive: Prostate seminal vesicles within normal limits. Other: No free air or fluid. Musculoskeletal: No acute osseous finding. No discrete lytic or blastic osseous lesions. IMPRESSION: 1. Punctate 3 mm stone at the left UVJ with secondary minimal asymmetric fullness of the left renal collecting system. No other radiopaque calculi identified. 2. No other acute intra-abdominal or pelvic process. Electronically Signed   By: Jeannine Boga M.D.   On: 07/26/2018 00:17    Procedures Procedures (including critical care time)  Medications Ordered in ED Medications  sodium chloride flush (NS) 0.9 % injection 3 mL (has no administration in time range)  ketorolac (TORADOL) 15 MG/ML injection 15 mg (15 mg Intramuscular Not Given 07/26/18 0048)  oxyCODONE-acetaminophen (PERCOCET/ROXICET) 5-325 MG per tablet 1 tablet (1 tablet Oral Given 07/26/18 0012)     Initial Impression / Assessment and Plan / ED Course  I have reviewed the triage vital signs and the nursing notes.  Pertinent labs & imaging results that were available during my care of the patient were reviewed by me and considered in my medical decision making (see chart for details).        Presents with right groin and perineal pain as well as onset of left flank pain.  Currently he is asymptomatic.  Reports some urinary symptoms.  Lab work reviewed.  No evidence of UTI.  No CVA tenderness.  Creatinine is slightly elevated at 1.4.  History is highly suggestive of kidney stones.  Patient states that this feels somewhat different than his prior kidney stone.  Will obtain CT stone study to verify.  Patient did not require any medications while in the emergency department.  CT stone study shows a 3 mm stone at the left UVJ.  Patient remains comfortable.  Will discharge with Flomax and a short course of pain medication.  Urology follow-up.  After history, exam, and  medical workup I feel the patient has been appropriately medically screened and is safe for discharge home. Pertinent diagnoses were discussed with the patient. Patient was given return precautions.   Final Clinical Impressions(s) / ED Diagnoses   Final diagnoses:  Kidney stone    ED Discharge Orders         Ordered    tamsulosin (FLOMAX) 0.4 MG CAPS capsule  Daily  07/26/18 0049    oxyCODONE-acetaminophen (PERCOCET/ROXICET) 5-325 MG tablet  Every 6 hours PRN     07/26/18 0049    ondansetron (ZOFRAN ODT) 4 MG disintegrating tablet  Every 8 hours PRN     07/26/18 0049           Merryl Hacker, MD 07/26/18 [id number]

## 2018-08-04 ENCOUNTER — Other Ambulatory Visit: Payer: Self-pay

## 2018-08-04 ENCOUNTER — Ambulatory Visit: Payer: BC Managed Care – PPO | Admitting: Emergency Medicine

## 2018-08-04 ENCOUNTER — Encounter: Payer: Self-pay | Admitting: Emergency Medicine

## 2018-08-04 VITALS — BP 137/78 | HR 55 | Temp 97.8°F | Ht 72.0 in | Wt 157.4 lb

## 2018-08-04 DIAGNOSIS — B029 Zoster without complications: Secondary | ICD-10-CM

## 2018-08-04 DIAGNOSIS — M79601 Pain in right arm: Secondary | ICD-10-CM | POA: Diagnosis not present

## 2018-08-04 MED ORDER — TRAMADOL HCL 50 MG PO TABS: 50.0000 mg | ORAL_TABLET | Freq: Three times a day (TID) | ORAL | 0 refills | Status: AC | PRN

## 2018-08-04 MED ORDER — VALACYCLOVIR HCL 1 G PO TABS: 1000.0000 mg | ORAL_TABLET | Freq: Two times a day (BID) | ORAL | 0 refills | Status: DC

## 2018-08-04 NOTE — Patient Instructions (Addendum)
If you have lab work done today you will be contacted with your lab results within the next 2 weeks.  If you have not heard from [place] then please contact us. The fastest way to get your results is to register for My Chart.   IF you received an x-ray today, you will receive an invoice from [facility]. Please contact [facility] at [phone] with questions or concerns regarding your invoice.   IF you received labwork today, you will receive an invoice from [facility]. Please contact [facility] at [phone] with questions or concerns regarding your invoice.   Our billing staff will not be able to assist you with questions regarding bills from these companies.  You will be contacted with the lab results as soon as they are available. The fastest way to get your results is to activate your My Chart account. Instructions are located on the last page of this paperwork. If you have not heard from [place] regarding the results in 2 weeks, please contact this office.      Shingles  Shingles is an infection. It gives you a painful skin rash and blisters that have fluid in them. Shingles is caused by the same germ (virus) that causes chickenpox. Shingles only happens in people who:  Have had chickenpox.  Have been given a shot of medicine (vaccine) to protect against chickenpox. Shingles is rare in this group. The first symptoms of shingles may be itching, tingling, or pain in an area on your skin. A rash will show on your skin a few days or weeks later. The rash is likely to be on one side of your body. The rash usually has a shape like a belt or a band. Over time, the rash turns into fluid-filled blisters. The blisters will break open, change into scabs, and dry up. Medicines may:  Help with pain and itching.  Help you get better sooner.  Help to prevent long-term problems. Follow these instructions at home: Medicines  Take over-the-counter and prescription medicines only  as told by your doctor.  Put on an anti-itch cream or numbing cream where you have a rash, blisters, or scabs. Do this as told by your doctor. Helping with itching and discomfort   Put cold, wet cloths (cold compresses) on the area of the rash or blisters as told by your doctor.  Cool baths can help you feel better. Try adding baking soda or dry oatmeal to the water to lessen itching. Do not bathe in hot water. Blister and rash care  Keep your rash covered with a loose bandage (dressing).  Wear loose clothing that does not rub on your rash.  Keep your rash and blisters clean. To do this, wash the area with mild soap and cool water as told by your doctor.  Check your rash every day for signs of infection. Check for: ? More redness, swelling, or pain. ? Fluid or blood. ? Warmth. ? Pus or a bad smell.  Do not scratch your rash. Do not pick at your blisters. To help you to not scratch: ? Keep your fingernails clean and cut short. ? Wear gloves or mittens when you sleep, if scratching is a problem. General instructions  Rest as told by your doctor.  Keep all follow-up visits as told by your doctor. This is important.  Wash your hands often with soap and water. If soap and water are not available, use hand sanitizer. Doing this lowers your chance of getting a skin  infection caused by germs (bacteria).  Your infection can cause chickenpox in people who have never had chickenpox or never got a shot of chickenpox vaccine. If you have blisters that did not change into scabs yet, try not to touch other people or be around other people, especially: ? Babies. ? Pregnant women. ? Children who have areas of red, itchy, or rough skin (eczema). ? Very old people who have transplants. ? People who have a long-term (chronic) sickness, like cancer or AIDS. Contact a doctor if:  Your pain does not get better with medicine.  Your pain does not get better after the rash heals.  You have any  signs of infection in the rash area. These signs include: ? More redness, swelling, or pain around the rash. ? Fluid or blood coming from the rash. ? The rash area feeling warm to the touch. ? Pus or a bad smell coming from the rash. Get help right away if:  The rash is on your face or nose.  You have pain in your face or pain by your eye.  You lose feeling on one side of your face.  You have trouble seeing.  You have ear pain, or you have ringing in your ear.  You have a loss of taste.  Your condition gets worse. Summary  Shingles gives you a painful skin rash and blisters that have fluid in them.  Shingles is an infection. It is caused by the same germ (virus) that causes chickenpox.  Keep your rash covered with a loose bandage (dressing). Wear loose clothing that does not rub on your rash.  If you have blisters that did not change into scabs yet, try not to touch other people or be around people. This information is not intended to replace advice given to you by your health care provider. Make sure you discuss any questions you have with your health care provider. Document Released: 06/24/2007 Document Revised: 04/29/2018 Document Reviewed: 09/09/2016 Elsevier Patient Education  2020 [employer].

## 2018-08-04 NOTE — Progress Notes (Signed)
Jeremy Vazquez 48 y.o.   Chief Complaint  Patient presents with  . right arm pain    4 days     HISTORY OF PRESENT ILLNESS: This is a 48 y.o. male complaining of severe right arm pain that started 4 days ago followed today by axillary rash.  No other significant symptoms.  HPI   Prior to Admission medications   Medication Sig Start Date End Date Taking? Authorizing Provider  aspirin (ASPIRIN LOW DOSE) 81 MG EC tablet Take 1 tablet (81 mg total) by mouth daily. Swallow whole. 03/18/18  Yes Isaiah Serge, NP  atorvastatin (LIPITOR) 40 MG tablet Take 1 tablet (40 mg total) by mouth daily. 11/22/17  Yes Belva Crome, MD  lisinopril (PRINIVIL,ZESTRIL) 5 MG tablet Take 1 tablet (5 mg total) by mouth daily. 03/18/18  Yes Isaiah Serge, NP  ondansetron (ZOFRAN ODT) 4 MG disintegrating tablet Take 1 tablet (4 mg total) by mouth every 8 (eight) hours as needed for nausea or vomiting. 07/26/18  Yes Horton, Barbette Hair, MD  oxyCODONE-acetaminophen (PERCOCET/ROXICET) 5-325 MG tablet Take 1 tablet by mouth every 6 (six) hours as needed for severe pain. 07/26/18  Yes Horton, Barbette Hair, MD  tamsulosin (FLOMAX) 0.4 MG CAPS capsule Take 1 capsule (0.4 mg total) by mouth daily. 07/26/18  Yes Horton, Barbette Hair, MD  nitroGLYCERIN (NITROSTAT) 0.4 MG SL tablet Place 1 tablet (0.4 mg total) under the tongue every 5 (five) minutes as needed for chest pain (up to 3 doses). Patient not taking: Reported on 08/04/2018 10/07/15   Belva Crome, MD    No Known Allergies  Patient Active Problem List   Diagnosis Date Noted  . Chronic diastolic heart failure ([place]) 09/26/2014  . CAD (coronary artery disease), native coronary artery 09/26/2014  . 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 Hyperlipidemia 06/08/2014  . GERD (gastroesophageal reflux disease) 06/29/2013  . Essential hypertension, benign 01/03/2013  . Leucopenia 01/03/2013    Past Medical History:  Diagnosis Date  . CAD (coronary artery  disease)    a. STEMI 05/2014 - s/p DES to LAD.  Marland Kitchen CAD (coronary artery disease), native coronary artery 09/26/2014   Proximal LAD stent.   . Chronic diastolic heart failure ([place]) 09/26/2014   Postinfarct ischemic cardiomyopathy initial EF during the acute MI 35% recovered to 50-55% post reperfusion. May 2016   . Dyslipidemia   . Essential hypertension, benign 01/03/2013  . GERD (gastroesophageal reflux disease) 06/29/2013  . Hyperlipidemia 06/08/2014  . Hypertension   . Ischemic cardiomyopathy    a. EF 35-40% by cath at time of STEMI 05/2014.  Marland Kitchen Kidney calculi 2017  . Leucopenia 01/03/2013  . 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 Acute EF 35% recovered to 55% post intervention.     Past Surgical History:  Procedure Laterality Date  . CARDIAC CATHETERIZATION N/A 06/08/2014   Procedure: Left Heart Cath and Coronary Angiography;  Surgeon: Belva Crome, MD;  Location: [facility];  Service: Cardiovascular;  Laterality: N/A;  . CARDIAC CATHETERIZATION N/A 06/08/2014   Procedure: Coronary Stent Intervention;  Surgeon: Belva Crome, MD;  Location: [facility];  Service: Cardiovascular;  Laterality: N/A;    Social History   Socioeconomic History  . Marital status: Married    Spouse name: Not on file  . Number of children: 7  . Years of education: Not on file  . Highest education level: Not on file  Occupational  History  . Occupation: [employer]  . Financial resource strain: Not on file  . Food insecurity    Worry: Not on file    Inability: Not on file  . Transportation needs    Medical: Not on file    Non-medical: Not on file  Tobacco Use  . Smoking status: Never Smoker  . Smokeless tobacco: Never Used  Substance and Sexual Activity  . Alcohol use: No  . Drug use: No  . Sexual activity: Not on file  Lifestyle  . Physical activity    Days per week: Not on file    Minutes per session: Not on file  . Stress: Not  on file  Relationships  . Social Herbalist on phone: Not on file    Gets together: Not on file    Attends religious service: Not on file    Active member of club or organization: Not on file    Attends meetings of clubs or organizations: Not on file    Relationship status: Not on file  . Intimate partner violence    Fear of current or ex partner: Not on file    Emotionally abused: Not on file    Physically abused: Not on file    Forced sexual activity: Not on file  Other Topics Concern  . Not on file  Social History Narrative  . Not on file    Family History  Problem Relation Age of Onset  . Diabetes type II Mother   . Other Father        STOMACH ISSUES  . Diabetes type II Sister   . Diabetes type II Brother   . Hypertension Brother   . Diabetes type II Brother      Review of Systems  Constitutional: Negative.  Negative for chills and fever.  HENT: Negative.  Negative for congestion and sore throat.   Eyes: Negative.   Respiratory: Negative.  Negative for cough and shortness of breath.   Cardiovascular: Negative.  Negative for chest pain and palpitations.  Gastrointestinal: Negative.  Negative for abdominal pain, diarrhea, nausea and vomiting.  Genitourinary: Negative.   Musculoskeletal:       Right arm pain  Skin: Positive for rash.  Neurological: Negative for dizziness and headaches.  All other systems reviewed and are negative.  Vitals:   08/04/18 0853  BP: 137/78  Pulse: (!) 55  Temp: 97.8 F (36.6 C)  SpO2: 98%     Physical Exam Vitals signs reviewed.  Constitutional:      Appearance: Normal appearance.  HENT:     Head: Normocephalic and atraumatic.  Eyes:     Extraocular Movements: Extraocular movements intact.     Pupils: Pupils are equal, round, and reactive to light.  Neck:     Musculoskeletal: Normal range of motion and neck supple.  Cardiovascular:     Rate and Rhythm: Normal rate and regular rhythm.     Pulses: Normal pulses.      Heart sounds: Normal heart sounds.  Pulmonary:     Effort: Pulmonary effort is normal.     Breath sounds: Normal breath sounds.  Abdominal:     Palpations: Abdomen is soft.     Tenderness: There is no abdominal tenderness.  Musculoskeletal: Normal range of motion.     Comments: Right upper extremity: No tenderness and no swelling.  Full range of motion.  Vesicular rash noted in axillary and elbow areas C-8 dermatome distribution.  Skin:  General: Skin is warm and dry.     Capillary Refill: Capillary refill takes less than 2 seconds.  Neurological:     General: No focal deficit present.     Mental Status: He is alert and oriented to person, place, and time.     Motor: No weakness.  Psychiatric:        Mood and Affect: Mood normal.        Behavior: Behavior normal.        A total of 25 minutes was spent in the room with the patient, greater than 50% of which was in counseling/coordination of care regarding diagnosis of shingles, etiology, management, medications and side effects, prognosis, and need for follow-up.   ASSESSMENT & PLAN: Darian was seen today for right arm pain.  Diagnoses and all orders for this visit:  Herpes zoster without complication -     valACYclovir (VALTREX) 1000 MG tablet; Take 1 tablet (1,000 mg total) by mouth 2 (two) times daily.  Right arm pain -     traMADol (ULTRAM) 50 MG tablet; Take 1 tablet (50 mg total) by mouth every 8 (eight) hours as needed for up to 5 days.    Patient Instructions       If you have lab work done today you will be contacted with your lab results within the next 2 weeks.  If you have not heard from [place] then please contact us. The fastest way to get your results is to register for My Chart.   IF you received an x-ray today, you will receive an invoice from [facility]. Please contact [facility] at [phone] with questions or concerns regarding your invoice.   IF you received labwork  today, you will receive an invoice from [place]. Please contact [facility] at [phone] with questions or concerns regarding your invoice.   Our billing staff will not be able to assist you with questions regarding bills from these companies.  You will be contacted with the lab results as soon as they are available. The fastest way to get your results is to activate your My Chart account. Instructions are located on the last page of this paperwork. If you have not heard from [place] regarding the results in 2 weeks, please contact this office.      Shingles  Shingles is an infection. It gives you a painful skin rash and blisters that have fluid in them. Shingles is caused by the same germ (virus) that causes chickenpox. Shingles only happens in people who:  Have had chickenpox.  Have been given a shot of medicine (vaccine) to protect against chickenpox. Shingles is rare in this group. The first symptoms of shingles may be itching, tingling, or pain in an area on your skin. A rash will show on your skin a few days or weeks later. The rash is likely to be on one side of your body. The rash usually has a shape like a belt or a band. Over time, the rash turns into fluid-filled blisters. The blisters will break open, change into scabs, and dry up. Medicines may:  Help with pain and itching.  Help you get better sooner.  Help to prevent long-term problems. Follow these instructions at home: Medicines  Take over-the-counter and prescription medicines only as told by your doctor.  Put on an anti-itch cream or numbing cream where you have a rash, blisters, or scabs. Do this as told by your doctor. Helping with itching and discomfort   Put cold, wet  cloths (cold compresses) on the area of the rash or blisters as told by your doctor.  Cool baths can help you feel better. Try adding baking soda or dry oatmeal to the water to lessen itching. Do not bathe in hot water. Blister and rash care   Keep your rash covered with a loose bandage (dressing).  Wear loose clothing that does not rub on your rash.  Keep your rash and blisters clean. To do this, wash the area with mild soap and cool water as told by your doctor.  Check your rash every day for signs of infection. Check for: ? More redness, swelling, or pain. ? Fluid or blood. ? Warmth. ? Pus or a bad smell.  Do not scratch your rash. Do not pick at your blisters. To help you to not scratch: ? Keep your fingernails clean and cut short. ? Wear gloves or mittens when you sleep, if scratching is a problem. General instructions  Rest as told by your doctor.  Keep all follow-up visits as told by your doctor. This is important.  Wash your hands often with soap and water. If soap and water are not available, use hand sanitizer. Doing this lowers your chance of getting a skin infection caused by germs (bacteria).  Your infection can cause chickenpox in people who have never had chickenpox or never got a shot of chickenpox vaccine. If you have blisters that did not change into scabs yet, try not to touch other people or be around other people, especially: ? Babies. ? Pregnant women. ? Children who have areas of red, itchy, or rough skin (eczema). ? Very old people who have transplants. ? People who have a long-term (chronic) sickness, like cancer or AIDS. Contact a doctor if:  Your pain does not get better with medicine.  Your pain does not get better after the rash heals.  You have any signs of infection in the rash area. These signs include: ? More redness, swelling, or pain around the rash. ? Fluid or blood coming from the rash. ? The rash area feeling warm to the touch. ? Pus or a bad smell coming from the rash. Get help right away if:  The rash is on your face or nose.  You have pain in your face or pain by your eye.  You lose feeling on one side of your face.  You have trouble seeing.  You have ear pain, or  you have ringing in your ear.  You have a loss of taste.  Your condition gets worse. Summary  Shingles gives you a painful skin rash and blisters that have fluid in them.  Shingles is an infection. It is caused by the same germ (virus) that causes chickenpox.  Keep your rash covered with a loose bandage (dressing). Wear loose clothing that does not rub on your rash.  If you have blisters that did not change into scabs yet, try not to touch other people or be around people. This information is not intended to replace advice given to you by your health care provider. Make sure you discuss any questions you have with your health care provider. Document Released: 06/24/2007 Document Revised: 04/29/2018 Document Reviewed: 09/09/2016 Elsevier Patient Education  2020 Elsevier Inc.       Agustina Caroli, MD [facility]

## 2018-08-15 DIAGNOSIS — R3912 Poor urinary stream: Secondary | ICD-10-CM | POA: Diagnosis not present

## 2018-08-15 DIAGNOSIS — N401 Enlarged prostate with lower urinary tract symptoms: Secondary | ICD-10-CM | POA: Diagnosis not present

## 2018-08-15 DIAGNOSIS — R350 Frequency of micturition: Secondary | ICD-10-CM | POA: Diagnosis not present

## 2018-08-15 DIAGNOSIS — N201 Calculus of ureter: Secondary | ICD-10-CM | POA: Diagnosis not present

## 2018-11-15 DIAGNOSIS — R3912 Poor urinary stream: Secondary | ICD-10-CM | POA: Diagnosis not present

## 2018-11-15 DIAGNOSIS — R350 Frequency of micturition: Secondary | ICD-10-CM | POA: Diagnosis not present

## 2018-11-15 DIAGNOSIS — N201 Calculus of ureter: Secondary | ICD-10-CM | POA: Diagnosis not present

## 2018-12-12 ENCOUNTER — Other Ambulatory Visit: Payer: Self-pay | Admitting: Interventional Cardiology

## 2018-12-13 ENCOUNTER — Other Ambulatory Visit: Payer: Self-pay | Admitting: Interventional Cardiology

## 2018-12-13 MED ORDER — LISINOPRIL 5 MG PO TABS: 5.0000 mg | ORAL_TABLET | Freq: Every day | ORAL | 0 refills | Status: DC

## 2018-12-13 NOTE — Telephone Encounter (Signed)
Pt's medication was sent to pt's pharmacy as requested. Confirmation received.  °

## 2019-02-12 NOTE — Progress Notes (Signed)
Cardiology Office Note:    Date:  02/13/2019   ID:  Jeremy Vazquez, DOB 06-20-1970, MRN [id number]  PCP:  Patient, No Pcp Per  Cardiologist:  Sinclair Grooms, MD   Referring MD: No ref. provider found   Chief Complaint  Patient presents with  . Coronary Artery Disease    History of Present Illness:    Jeremy Vazquez is a 49 y.o. male with a hx of CAD with recent history of LAD STEMI (DES mid vessel), transient acute systolic heart failure resolved to diastolic heart failure with EF greater than 50%, essential hypertension, and hyperlipidemia.   He is not interested in the Covid vaccine.  He has no cardiac complaints.  His job requires activity and therefore he feels certain that he is achieving the basal 150 minutes of moderate activity per week.    Past Medical History:  Diagnosis Date  . CAD (coronary artery disease)    a. STEMI 05/2014 - s/p DES to LAD.  Marland Kitchen CAD (coronary artery disease), native coronary artery 09/26/2014   Proximal LAD stent.   . Chronic diastolic heart failure ([place]) 09/26/2014   Postinfarct ischemic cardiomyopathy initial EF during the acute MI 35% recovered to 50-55% post reperfusion. May 2016   . Dyslipidemia   . Essential hypertension, benign 01/03/2013  . GERD (gastroesophageal reflux disease) 06/29/2013  . Hyperlipidemia 06/08/2014  . Hypertension   . Ischemic cardiomyopathy    a. EF 35-40% by cath at time of STEMI 05/2014.  Marland Kitchen Kidney calculi 2017  . Leucopenia 01/03/2013  . 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 Acute EF 35% recovered to 55% post intervention.     Past Surgical History:  Procedure Laterality Date  . CARDIAC CATHETERIZATION N/A 06/08/2014   Procedure: Left Heart Cath and Coronary Angiography;  Surgeon: Belva Crome, MD;  Location: [facility];  Service: Cardiovascular;  Laterality: N/A;  . CARDIAC CATHETERIZATION N/A 06/08/2014   Procedure: Coronary Stent Intervention;  Surgeon: Belva Crome, MD;  Location: [facility];  Service: Cardiovascular;  Laterality: N/A;    Current Medications: Current Meds  Medication Sig  . aspirin (ASPIRIN LOW DOSE) 81 MG EC tablet Take 1 tablet (81 mg total) by mouth daily. Swallow whole.  Marland Kitchen atorvastatin (LIPITOR) 40 MG tablet Take 1 tablet (40 mg total) by mouth daily.  Marland Kitchen lisinopril (ZESTRIL) 5 MG tablet Take 1 tablet (5 mg total) by mouth daily.  . nitroGLYCERIN (NITROSTAT) 0.4 MG SL tablet Place 1 tablet (0.4 mg total) under the tongue every 5 (five) minutes as needed for chest pain (up to 3 doses).  . [DISCONTINUED] lisinopril (ZESTRIL) 5 MG tablet Take 1 tablet (5 mg total) by mouth daily. Please keep upcoming appt in January with Dr. Tamala Julian for future refills. Thank you   Current Facility-Administered Medications for the 02/13/19 encounter (Office Visit) with Belva Crome, MD  Medication  . 0.9 %  sodium chloride infusion     Allergies:   Patient has no known allergies.   Social History   Socioeconomic History  . Marital status: Married    Spouse name: Not on file  . Number of children: 7  . Years of education: Not on file  . Highest education level: Not on file  Occupational History  . Occupation: [employer] manager  . Smoking status: Never Smoker  . Smokeless tobacco: Never Used  Substance and Sexual Activity  . Alcohol  use: No  . Drug use: No  . Sexual activity: Not on file  Other Topics Concern  . Not on file  Social History Narrative  . Not on file   Social Determinants of Health   Financial Resource Strain:   . Difficulty of Paying Living Expenses: Not on file  Food Insecurity:   . Worried About Charity fundraiser in the Last Year: Not on file  . Ran Out of Food in the Last Year: Not on file  Transportation Needs:   . Lack of Transportation (Medical): Not on file  . Lack of Transportation (Non-Medical): Not on file  Physical Activity:   . Days of Exercise per Week: Not on file  .  Minutes of Exercise per Session: Not on file  Stress:   . Feeling of Stress : Not on file  Social Connections:   . Frequency of Communication with Friends and Family: Not on file  . Frequency of Social Gatherings with Friends and Family: Not on file  . Attends Religious Services: Not on file  . Active Member of Clubs or Organizations: Not on file  . Attends Archivist Meetings: Not on file  . Marital Status: Not on file     Family History: The patient's family history includes Diabetes type II in his brother, brother, mother, and sister; Hypertension in his brother; Other in his father.  ROS:   Please see the history of present illness.    We will not use COVID-19 vaccine.  All other systems reviewed and are negative.  EKGs/Labs/Other Studies Reviewed:    The following studies were reviewed today: No new imaging or functional data  EKG:  EKG normal sinus rhythm, prominent voltage, normal intervals.  When compared to the prior tracing from 2019, no significant change has occurred.  Recent Labs: 07/25/2018: ALT 31; BUN 19; Creatinine, Ser 1.42; Hemoglobin 17.0; Platelets 228; Potassium 4.1; Sodium 141  Recent Lipid Panel    Component Value Date/Time   CHOL 120 11/22/2017 1017   TRIG 86 11/22/2017 1017   HDL 40 11/22/2017 1017   CHOLHDL 3.0 11/22/2017 1017   CHOLHDL 3.2 06/24/2015 1242   VLDL 12 06/24/2015 1242   LDLCALC 63 11/22/2017 1017    Physical Exam:    VS:  BP 132/90   Pulse 64   Ht 6' (1.829 m)   Wt 161 lb 3.2 oz (73.1 kg)   SpO2 99%   BMI 21.86 kg/m     Wt Readings from Last 3 Encounters:  02/13/19 161 lb 3.2 oz (73.1 kg)  08/04/18 157 lb 6.4 oz (71.4 kg)  07/25/18 155 lb (70.3 kg)     GEN: Slender and appears younger than stated age.. No acute distress HEENT: Normal NECK: No JVD. LYMPHATICS: No lymphadenopathy CARDIAC:  RRR without murmur, gallop, or edema. VASCULAR:  Normal Pulses. No bruits. RESPIRATORY:  Clear to auscultation without  rales, wheezing or rhonchi  ABDOMEN: Soft, non-tender, non-distended, No pulsatile mass, MUSCULOSKELETAL: No deformity  SKIN: Warm and dry NEUROLOGIC:  Alert and oriented x 3 PSYCHIATRIC:  Normal affect   ASSESSMENT:    1. Coronary artery disease involving native coronary artery of native heart without angina pectoris   2. Essential hypertension   3. Chronic diastolic heart failure (HCC)   4. Other hyperlipidemia   5. Educated about COVID-19 virus infection    PLAN:    In order of problems listed above:  1. Secondary prevention discussed in detail 2. Low-salt diet discussed 3.  No volume overload 4. LDL target less than 70 5. 3W's discussed with the patient.  The Cold vaccine was also discussed.  The patient states that he is not interested.  We will continue the dialogue.     Medication Adjustments/Labs and Tests Ordered: Current medicines are reviewed at length with the patient today.  Concerns regarding medicines are outlined above.  Orders Placed This Encounter  Procedures  . EKG 12-Lead   Meds ordered this encounter  Medications  . lisinopril (ZESTRIL) 5 MG tablet    Sig: Take 1 tablet (5 mg total) by mouth daily.    Dispense:  90 tablet    Refill:  3    There are no Patient Instructions on file for this visit.   Signed, Sinclair Grooms, MD  02/13/2019 8:47 AM    [place]

## 2019-02-13 ENCOUNTER — Ambulatory Visit (INDEPENDENT_AMBULATORY_CARE_PROVIDER_SITE_OTHER): Payer: BC Managed Care – PPO | Admitting: Interventional Cardiology

## 2019-02-13 ENCOUNTER — Other Ambulatory Visit: Payer: Self-pay

## 2019-02-13 ENCOUNTER — Encounter (INDEPENDENT_AMBULATORY_CARE_PROVIDER_SITE_OTHER): Payer: Self-pay

## 2019-02-13 ENCOUNTER — Encounter: Payer: Self-pay | Admitting: Interventional Cardiology

## 2019-02-13 VITALS — BP 132/90 | HR 64 | Ht 72.0 in | Wt 161.2 lb

## 2019-02-13 DIAGNOSIS — I10 Essential (primary) hypertension: Secondary | ICD-10-CM

## 2019-02-13 DIAGNOSIS — E7849 Other hyperlipidemia: Secondary | ICD-10-CM

## 2019-02-13 DIAGNOSIS — Z131 Encounter for screening for diabetes mellitus: Secondary | ICD-10-CM | POA: Diagnosis not present

## 2019-02-13 DIAGNOSIS — I5032 Chronic diastolic (congestive) heart failure: Secondary | ICD-10-CM

## 2019-02-13 DIAGNOSIS — Z7189 Other specified counseling: Secondary | ICD-10-CM

## 2019-02-13 DIAGNOSIS — I2510 Atherosclerotic heart disease of native coronary artery without angina pectoris: Secondary | ICD-10-CM | POA: Diagnosis not present

## 2019-02-13 LAB — LIPID PANEL
Chol/HDL Ratio: 4.3 ratio (ref 0.0–5.0)
Cholesterol, Total: 163 mg/dL (ref 100–199)
HDL: 38 mg/dL — ABNORMAL LOW (ref >39)
LDL Chol Calc (NIH): 94 mg/dL (ref 0–99)
Triglycerides: 177 mg/dL — ABNORMAL HIGH (ref 0–149)
VLDL Cholesterol Cal: 31 mg/dL (ref 5–40)

## 2019-02-13 LAB — HEPATIC FUNCTION PANEL
ALT: 25 IU/L (ref 0–44)
AST: 20 IU/L (ref 0–40)
Albumin: 4.8 g/dL (ref 3.8–4.9)
Alkaline Phosphatase: 64 IU/L (ref 39–117)
Bilirubin Total: 0.3 mg/dL (ref 0.0–1.2)
Bilirubin, Direct: 0.13 mg/dL (ref 0.00–0.40)
Total Protein: 7.6 g/dL (ref 6.0–8.5)

## 2019-02-13 LAB — BASIC METABOLIC PANEL
BUN/Creatinine Ratio: 14 (ref 9–20)
BUN: 12 mg/dL (ref 6–24)
CO2: 25 mmol/L (ref 20–29)
Calcium: 9.8 mg/dL (ref 8.7–10.2)
Chloride: 101 mmol/L (ref 96–106)
Creatinine, Ser: 0.88 mg/dL (ref 0.76–1.27)
GFR calc Af Amer: 115 mL/min/{1.73_m2} (ref >59)
GFR calc non Af Amer: 99 mL/min/{1.73_m2} (ref >59)
Glucose: 90 mg/dL (ref 65–99)
Potassium: 4.2 mmol/L (ref 3.5–5.2)
Sodium: 139 mmol/L (ref 134–144)

## 2019-02-13 LAB — HEMOGLOBIN A1C
Est. average glucose Bld gHb Est-mCnc: 117 mg/dL
Hgb A1c MFr Bld: 5.7 % — ABNORMAL HIGH (ref 4.8–5.6)

## 2019-02-13 MED ORDER — LISINOPRIL 5 MG PO TABS: 5.0000 mg | ORAL_TABLET | Freq: Every day | ORAL | 3 refills | Status: DC

## 2019-02-13 NOTE — Patient Instructions (Signed)
Medication Instructions:  Your physician recommends that you continue on your current medications as directed. Please refer to the Current Medication list given to you today.  *If you need a refill on your cardiac medications before your next appointment, please call your pharmacy*  Lab Work: BMET, Lipid, Liver and A1C today  If you have labs (blood work) drawn today and your tests are completely normal, you will receive your results only by: Marland Kitchen MyChart Message (if you have MyChart) OR . A paper copy in the mail If you have any lab test that is abnormal or we need to change your treatment, we will call you to review the results.  Testing/Procedures: None  Follow-Up: At [facility], you and your health needs are our priority.  As part of our continuing mission to provide you with exceptional heart care, we have created designated Provider Care Teams.  These Care Teams include your primary Cardiologist (physician) and Advanced Practice Providers (APPs -  Physician Assistants and Nurse Practitioners) who all work together to provide you with the care you need, when you need it.  Your next appointment:   12 month(s)  The format for your next appointment:   In Person  Provider:   You may see Sinclair Grooms, MD or one of the following Advanced Practice Providers on your designated Care Team:    Truitt Merle, NP  Cecilie Kicks, NP  Kathyrn Drown, NP   Other Instructions

## 2019-02-15 ENCOUNTER — Telehealth: Payer: Self-pay | Admitting: *Deleted

## 2019-02-15 MED ORDER — ROSUVASTATIN CALCIUM 40 MG PO TABS: 40.0000 mg | ORAL_TABLET | Freq: Every day | ORAL | 3 refills | Status: DC

## 2019-02-15 MED ORDER — ICOSAPENT ETHYL 1 G PO CAPS: 2.0000 g | ORAL_CAPSULE | Freq: Two times a day (BID) | ORAL | 11 refills | Status: DC

## 2019-02-15 NOTE — Telephone Encounter (Signed)
-----   Message from Belva Crome, MD sent at 02/14/2019  8:42 PM EST ----- Let the patient know the LDL target is 70 and TG too high. Change atorvastatin to Rosuvastatin 40 mg daily. Consider VASCEPA 2 gm BID if he can afford. A copy will be sent to Patient, No Pcp Per

## 2019-02-15 NOTE — Telephone Encounter (Signed)
Spoke with pt and reviewed results and recommendations.  Pt agreeable to plan.  He will call if unable to afford Vascepa.  Pt verbalized understanding and was in agreement with plan.

## 2019-02-16 ENCOUNTER — Telehealth: Payer: Self-pay

## 2019-02-16 NOTE — Telephone Encounter (Signed)
**Note De-Identified Jeremy Vazquez Obfuscation** I started a Isosapent/Vascepa PA through covermymeds. Key: [id number]

## 2019-02-16 NOTE — Telephone Encounter (Signed)
Following message sent from covermymeds:  Laverna Peace Key: [id number] - PA Case ID: [id number] - Rx #: [id number] Outcome  Approved today  PA Case: [id number]  Status: Approved, Coverage Starts on: 02/16/2019 12:00:00 AM, Coverage Ends on: 02/16/2020 12:00:00 AM.  Drug Icosapent Ethyl 1GM capsules  Form Librarian, academic PA Form (2017 NCPDP)   I have notified [place] that this was approved. They re-ran the RX and confirmed that the pts INS is now covering his Vascepa.

## 2019-02-22 ENCOUNTER — Other Ambulatory Visit: Payer: Self-pay | Admitting: *Deleted

## 2019-02-22 DIAGNOSIS — E7849 Other hyperlipidemia: Secondary | ICD-10-CM

## 2019-03-13 ENCOUNTER — Other Ambulatory Visit: Payer: Self-pay | Admitting: Interventional Cardiology

## 2019-03-20 ENCOUNTER — Telehealth: Payer: Self-pay | Admitting: Interventional Cardiology

## 2019-03-20 NOTE — Telephone Encounter (Signed)
New message   Patient would like a call to cholesterol medication. He states that he is having pain in his knees. Please call.

## 2019-03-20 NOTE — Telephone Encounter (Signed)
Pt recently started on Rosuvastatin and has developed knee and lower back pain.  States knees are now popping and cracking.  Advised the noises are more from arthritis.  Advised I will send to Dr. Tamala Julian for review and advisement.

## 2019-03-22 NOTE — Telephone Encounter (Signed)
Stop the medication for 2 weeks then resume.  It is unlikely that this medication is causing his needs to pop.  This is most likely related to arthritis.

## 2019-03-22 NOTE — Telephone Encounter (Signed)
Spoke with pt and reviewed recommendations. Pt verbalized understanding and was in agreement with plan.  

## 2019-04-18 ENCOUNTER — Other Ambulatory Visit: Payer: BC Managed Care – PPO

## 2019-04-18 ENCOUNTER — Other Ambulatory Visit: Payer: Self-pay

## 2019-04-18 DIAGNOSIS — E7849 Other hyperlipidemia: Secondary | ICD-10-CM | POA: Diagnosis not present

## 2019-04-18 LAB — LIPID PANEL
Chol/HDL Ratio: 2.4 ratio (ref 0.0–5.0)
Cholesterol, Total: 85 mg/dL — ABNORMAL LOW (ref 100–199)
HDL: 35 mg/dL — ABNORMAL LOW (ref >39)
LDL Chol Calc (NIH): 33 mg/dL (ref 0–99)
Triglycerides: 79 mg/dL (ref 0–149)
VLDL Cholesterol Cal: 17 mg/dL (ref 5–40)

## 2019-04-18 LAB — HEPATIC FUNCTION PANEL
ALT: 42 IU/L (ref 0–44)
AST: 33 IU/L (ref 0–40)
Albumin: 4.4 g/dL (ref 3.8–4.9)
Alkaline Phosphatase: 74 IU/L (ref 39–117)
Bilirubin Total: 0.3 mg/dL (ref 0.0–1.2)
Bilirubin, Direct: 0.13 mg/dL (ref 0.00–0.40)
Total Protein: 7.2 g/dL (ref 6.0–8.5)

## 2019-05-27 ENCOUNTER — Other Ambulatory Visit: Payer: Self-pay | Admitting: Interventional Cardiology

## 2019-06-02 ENCOUNTER — Telehealth: Payer: Self-pay | Admitting: Student

## 2019-06-02 NOTE — Telephone Encounter (Signed)
Called overnight by Mr. Blomme whose blood pressure was elevated to 180/100.  He has been hypertensive this afternoon after running for the first time in the while.  Endorses that he normally has BP [id number] systolic range.  Denies headache, SOB, visual changes, nausea, chest pain.  Instructed to take an extra lisinopril and if BP >190/110, come to ED.  If not, can follow-up with Dr. Tamala Julian for BP management.

## 2019-07-28 ENCOUNTER — Ambulatory Visit: Payer: BC Managed Care – PPO | Admitting: Registered Nurse

## 2019-07-28 ENCOUNTER — Encounter: Payer: Self-pay | Admitting: Registered Nurse

## 2019-07-28 ENCOUNTER — Other Ambulatory Visit: Payer: Self-pay

## 2019-07-28 VITALS — BP 149/84 | HR 67 | Temp 98.0°F | Ht 72.0 in | Wt 163.0 lb

## 2019-07-28 DIAGNOSIS — B360 Pityriasis versicolor: Secondary | ICD-10-CM

## 2019-07-28 DIAGNOSIS — J302 Other seasonal allergic rhinitis: Secondary | ICD-10-CM

## 2019-07-28 MED ORDER — FLUTICASONE PROPIONATE 50 MCG/ACT NA SUSP: 2.0000 | Freq: Every day | NASAL | 6 refills | Status: DC

## 2019-07-28 MED ORDER — CETIRIZINE HCL 10 MG PO TABS: 10.0000 mg | ORAL_TABLET | Freq: Every day | ORAL | 11 refills | Status: DC

## 2019-07-28 MED ORDER — TERBINAFINE HCL 1 % EX CREA: 1.0000 "application " | TOPICAL_CREAM | Freq: Two times a day (BID) | CUTANEOUS | 0 refills | Status: DC

## 2019-07-28 NOTE — Patient Instructions (Signed)
° ° ° °  If you have lab work done today you will be contacted with your lab results within the next 2 weeks.  If you have not heard from us then please contact us. The fastest way to get your results is to register for My Chart. ° ° °IF you received an x-ray today, you will receive an invoice from [facility]. Please contact [facility] at [phone] with questions or concerns regarding your invoice.  ° °IF you received labwork today, you will receive an invoice from [facility]. Please contact [facility] at [phone] with questions or concerns regarding your invoice.  ° °Our billing staff will not be able to assist you with questions regarding bills from these companies. ° °You will be contacted with the lab results as soon as they are available. The fastest way to get your results is to activate your My Chart account. Instructions are located on the last page of this paperwork. If you have not heard from us regarding the results in 2 weeks, please contact this office. °  ° ° ° °

## 2019-07-28 NOTE — Progress Notes (Signed)
Acute Office Visit  Subjective:    Patient ID: Jeremy Vazquez, male    DOB: 1970/02/23, 49 y.o.   MRN: [id number]  Chief Complaint  Patient presents with  . Sinus Problem    worsened congestion - allergy related to weather- not taking any otc  . R leg lightened area    x 6 months     HPI Patient is in today for allergic rhinitis  Has been having nasal congestion on and off all year - usually happens for a few months of the year but he is able to bear it. This year it has been worse and unrelenting. Finding it hard to sleep because his nose feels blocked.  No sinus pain or pressure. No drainage. No cough. No sore throat. No shortness of breath. No new exposures ie pets, detergents, living space, etc.  Also notes a white spot on the front of his r lower leg. Has been stable. Was itchy, not anymore. No pain. Not spreading. No assault on this place.  Past Medical History:  Diagnosis Date  . CAD (coronary artery disease)    a. STEMI 05/2014 - s/p DES to LAD.  Marland Kitchen CAD (coronary artery disease), native coronary artery 09/26/2014   Proximal LAD stent.   . Chronic diastolic heart failure ([place]) 09/26/2014   Postinfarct ischemic cardiomyopathy initial EF during the acute MI 35% recovered to 50-55% post reperfusion. May 2016   . Dyslipidemia   . Essential hypertension, benign 01/03/2013  . GERD (gastroesophageal reflux disease) 06/29/2013  . Hyperlipidemia 06/08/2014  . Hypertension   . Ischemic cardiomyopathy    a. EF 35-40% by cath at time of STEMI 05/2014.  Marland Kitchen Kidney calculi 2017  . Leucopenia 01/03/2013  . 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 Acute EF 35% recovered to 55% post intervention.     Past Surgical History:  Procedure Laterality Date  . CARDIAC CATHETERIZATION N/A 06/08/2014   Procedure: Left Heart Cath and Coronary Angiography;  Surgeon: Belva Crome, MD;  Location: [facility];  Service: Cardiovascular;  Laterality: N/A;  . CARDIAC  CATHETERIZATION N/A 06/08/2014   Procedure: Coronary Stent Intervention;  Surgeon: Belva Crome, MD;  Location: [facility];  Service: Cardiovascular;  Laterality: N/A;    Family History  Problem Relation Age of Onset  . Diabetes type II Mother   . Other Father        STOMACH ISSUES  . Diabetes type II Sister   . Diabetes type II Brother   . Hypertension Brother   . Diabetes type II Brother     Social History   Socioeconomic History  . Marital status: Married    Spouse name: Not on file  . Number of children: 7  . Years of education: Not on file  . Highest education level: Not on file  Occupational History  . Occupation: [employer] manager  . Smoking status: Never Smoker  . Smokeless tobacco: Never Used  Vaping Use  . Vaping Use: Never used  Substance and Sexual Activity  . Alcohol use: No  . Drug use: No  . Sexual activity: Not on file  Other Topics Concern  . Not on file  Social History Narrative  . Not on file   Social Determinants of Health   Financial Resource Strain:   . Difficulty of Paying Living Expenses:   Food Insecurity:   . Worried About Charity fundraiser in the  Last Year:   . [place] in the Last Year:   Transportation Needs:   . Film/video editor (Medical):   Marland Kitchen Lack of Transportation (Non-Medical):   Physical Activity:   . Days of Exercise per Week:   . Minutes of Exercise per Session:   Stress:   . Feeling of Stress :   Social Connections:   . Frequency of Communication with Friends and Family:   . Frequency of Social Gatherings with Friends and Family:   . Attends Religious Services:   . Active Member of Clubs or Organizations:   . Attends Archivist Meetings:   Marland Kitchen Marital Status:   Intimate Partner Violence:   . Fear of Current or Ex-Partner:   . Emotionally Abused:   Marland Kitchen Physically Abused:   . Sexually Abused:     Outpatient Medications Prior to Visit  Medication Sig Dispense Refill  . EQ  ASPIRIN ADULT LOW DOSE 81 MG EC tablet Take 1 tablet by mouth once daily 120 tablet 0  . icosapent Ethyl (VASCEPA) 1 g capsule Take 2 capsules (2 g total) by mouth 2 (two) times daily. 120 capsule 11  . lisinopril (ZESTRIL) 5 MG tablet Take 1 tablet (5 mg total) by mouth daily. 90 tablet 3  . nitroGLYCERIN (NITROSTAT) 0.4 MG SL tablet Place 1 tablet (0.4 mg total) under the tongue every 5 (five) minutes as needed for chest pain (up to 3 doses). 25 tablet 1  . rosuvastatin (CRESTOR) 40 MG tablet Take 1 tablet (40 mg total) by mouth daily. 90 tablet 3  . 0.9 %  sodium chloride infusion      No facility-administered medications prior to visit.    No Known Allergies  Review of Systems  Constitutional: Negative.   HENT: Positive for congestion. Negative for dental problem, drooling, ear discharge, ear pain, facial swelling, hearing loss, mouth sores, nosebleeds, postnasal drip, rhinorrhea, sinus pressure, sinus pain, sneezing, sore throat, tinnitus, trouble swallowing and voice change.   Eyes: Negative.   Respiratory: Negative.   Cardiovascular: Negative.   Gastrointestinal: Negative.   Genitourinary: Negative.   Musculoskeletal: Negative.   Skin: Positive for color change. Negative for pallor, rash and wound.  Allergic/Immunologic: Positive for environmental allergies.  Neurological: Negative.   Hematological: Negative.   Psychiatric/Behavioral: Negative.   All other systems reviewed and are negative.      Objective:    Physical Exam Vitals and nursing note reviewed.  Constitutional:      General: He is not in acute distress.    Appearance: Normal appearance. He is normal weight. He is not ill-appearing, toxic-appearing or diaphoretic.  HENT:     Nose: Mucosal edema and congestion present. No nasal deformity, septal deviation, signs of injury, laceration, nasal tenderness or rhinorrhea.     Right Nostril: No foreign body, epistaxis, septal hematoma or occlusion.     Left Nostril:  No foreign body, epistaxis, septal hematoma or occlusion.     Right Turbinates: Enlarged and swollen. Not pale.     Left Turbinates: Enlarged and swollen. Not pale.     Right Sinus: No maxillary sinus tenderness or frontal sinus tenderness.     Left Sinus: No maxillary sinus tenderness or frontal sinus tenderness.  Skin:    General: Skin is warm and dry.     Coloration: Skin is not jaundiced or pale.     Findings: Lesion (hypopigmented well circumscribed lesion on the anterior upper right shin. about 2' tall, 1' wide.) present.  No bruising, erythema or rash.  Neurological:     General: No focal deficit present.     Mental Status: He is alert and oriented to person, place, and time. Mental status is at baseline.  Psychiatric:        Mood and Affect: Mood normal.        Behavior: Behavior normal.        Thought Content: Thought content normal.        Judgment: Judgment normal.     BP (!) 149/84   Pulse 67   Temp 98 F (36.7 C)   Ht 6' (1.829 m)   Wt 163 lb (73.9 kg)   SpO2 100%   BMI 22.11 kg/m  Wt Readings from Last 3 Encounters:  07/28/19 163 lb (73.9 kg)  02/13/19 161 lb 3.2 oz (73.1 kg)  08/04/18 157 lb 6.4 oz (71.4 kg)    Health Maintenance Due  Topic Date Due  . COVID-19 Vaccine (1) Never done  . TETANUS/TDAP  Never done    There are no preventive care reminders to display for this patient.   Lab Results  Component Value Date   TSH 0.916 06/08/2014   Lab Results  Component Value Date   WBC 3.9 (L) 07/25/2018   HGB 17.0 07/25/2018   HCT 50.4 07/25/2018   MCV 89.8 07/25/2018   PLT 228 07/25/2018   Lab Results  Component Value Date   NA 139 02/13/2019   K 4.2 02/13/2019   CO2 25 02/13/2019   GLUCOSE 90 02/13/2019   BUN 12 02/13/2019   CREATININE 0.88 02/13/2019   BILITOT 0.3 04/18/2019   ALKPHOS 74 04/18/2019   AST 33 04/18/2019   ALT 42 04/18/2019   PROT 7.2 04/18/2019   ALBUMIN 4.4 04/18/2019   CALCIUM 9.8 02/13/2019   ANIONGAP 8 07/25/2018    Lab Results  Component Value Date   CHOL 85 (L) 04/18/2019   Lab Results  Component Value Date   HDL 35 (L) 04/18/2019   Lab Results  Component Value Date   LDLCALC 33 04/18/2019   Lab Results  Component Value Date   TRIG 79 04/18/2019   Lab Results  Component Value Date   CHOLHDL 2.4 04/18/2019   Lab Results  Component Value Date   HGBA1C 5.7 (H) 02/13/2019       Assessment & Plan:   Problem List Items Addressed This Visit    None    Visit Diagnoses    Tinea versicolor    -  Primary   Relevant Medications   terbinafine (LAMISIL) 1 % cream   Seasonal allergies       Relevant Medications   cetirizine (ZYRTEC) 10 MG tablet   fluticasone (FLONASE) 50 MCG/ACT nasal spray       Meds ordered this encounter  Medications  . terbinafine (LAMISIL) 1 % cream    Sig: Apply 1 application topically 2 (two) times daily.    Dispense:  30 g    Refill:  0    Order Specific Question:   Supervising Provider    Answer:   Carlota Raspberry, JEFFREY R [[id number]]  . cetirizine (ZYRTEC) 10 MG tablet    Sig: Take 1 tablet (10 mg total) by mouth daily.    Dispense:  30 tablet    Refill:  11    Order Specific Question:   Supervising Provider    Answer:   Carlota Raspberry, JEFFREY R [[id number]]  . fluticasone (FLONASE) 50 MCG/ACT nasal spray    Sig:  Place 2 sprays into both nostrils daily.    Dispense:  16 g    Refill:  6    Order Specific Question:   Supervising Provider    Answer:   Carlota Raspberry, JEFFREY R [[id number]]   PLAN  No rhinorrhea but obvious inflammation  Seasonal allergies with allergic rhinitis: zyrtec and flonase.  Rash on leg appears to be tinea versicolor. Will sent terbinafine topical. Return if worsening or failing to improve.  Maximiano Coss, NP

## 2019-09-21 ENCOUNTER — Encounter: Payer: BC Managed Care – PPO | Admitting: Family Medicine

## 2019-10-02 ENCOUNTER — Other Ambulatory Visit: Payer: Self-pay

## 2019-10-02 ENCOUNTER — Encounter: Payer: BC Managed Care – PPO | Admitting: Family Medicine

## 2019-10-08 DIAGNOSIS — Z20822 Contact with and (suspected) exposure to covid-19: Secondary | ICD-10-CM | POA: Diagnosis not present

## 2020-03-11 ENCOUNTER — Other Ambulatory Visit: Payer: Self-pay | Admitting: Interventional Cardiology

## 2020-03-24 NOTE — Progress Notes (Signed)
Cardiology Office Note:    Date:  03/25/2020   ID:  Jeremy Vazquez, DOB 01/10/1971, MRN [id number]  PCP:  Patient, No Pcp Per  Cardiologist:  Sinclair Grooms, MD   Referring MD: No ref. provider found   Chief Complaint  Patient presents with  . Coronary Artery Disease    History of Present Illness:    Jeremy Vazquez is a 50 y.o. male with a hx of CAD with recent history of LAD STEMI (DES mid vessel), transient acute systolic heart failure resolved to diastolic heart failure with EF greater than 50%, essential hypertension, and hyperlipidemia.  Very active at home.  More than 150 minutes of moderate activity per week.  Fasting this morning.  Has not taken his medications yet today.  He denies claudication.  Sleeping well.  Appetite has been stable.  Past Medical History:  Diagnosis Date  . CAD (coronary artery disease)    a. STEMI 05/2014 - s/p DES to LAD.  Marland Kitchen CAD (coronary artery disease), native coronary artery 09/26/2014   Proximal LAD stent.   . Chronic diastolic heart failure ([place]) 09/26/2014   Postinfarct ischemic cardiomyopathy initial EF during the acute MI 35% recovered to 50-55% post reperfusion. May 2016   . Dyslipidemia   . Essential hypertension, benign 01/03/2013  . GERD (gastroesophageal reflux disease) 06/29/2013  . Hyperlipidemia 06/08/2014  . Hypertension   . Ischemic cardiomyopathy    a. EF 35-40% by cath at time of STEMI 05/2014.  Marland Kitchen Kidney calculi 2017  . Leucopenia 01/03/2013  . 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 Acute EF 35% recovered to 55% post intervention.     Past Surgical History:  Procedure Laterality Date  . CARDIAC CATHETERIZATION N/A 06/08/2014   Procedure: Left Heart Cath and Coronary Angiography;  Surgeon: Belva Crome, MD;  Location: [facility];  Service: Cardiovascular;  Laterality: N/A;  . CARDIAC CATHETERIZATION N/A 06/08/2014   Procedure: Coronary Stent Intervention;  Surgeon: Belva Crome, MD;  Location: [facility];  Service: Cardiovascular;  Laterality: N/A;    Current Medications: Current Meds  Medication Sig  . EQ ASPIRIN ADULT LOW DOSE 81 MG EC tablet Take 1 tablet by mouth once daily  . fluticasone (FLONASE) 50 MCG/ACT nasal spray Place 2 sprays into both nostrils daily.  Marland Kitchen lisinopril (ZESTRIL) 5 MG tablet Take 1 tablet (5 mg total) by mouth daily. Please keep upcoming appt in March 2022 with Dr. Tamala Julian before anymore refills.Thank you  . nitroGLYCERIN (NITROSTAT) 0.4 MG SL tablet Place 1 tablet (0.4 mg total) under the tongue every 5 (five) minutes as needed for chest pain (up to 3 doses).  . rosuvastatin (CRESTOR) 40 MG tablet Take 1 tablet (40 mg total) by mouth daily.     Allergies:   Patient has no known allergies.   Social History   Socioeconomic History  . Marital status: Married    Spouse name: Not on file  . Number of children: 7  . Years of education: Not on file  . Highest education level: Not on file  Occupational History  . Occupation: [employer] manager  . Smoking status: Never Smoker  . Smokeless tobacco: Never Used  Vaping Use  . Vaping Use: Never used  Substance and Sexual Activity  . Alcohol use: No  . Drug use: No  . Sexual activity: Not on file  Other Topics Concern  . Not on file  Social History Narrative  . Not on file   Social Determinants of Health   Financial Resource Strain: Not on file  Food Insecurity: Not on file  Transportation Needs: Not on file  Physical Activity: Not on file  Stress: Not on file  Social Connections: Not on file     Family History: The patient's family history includes Diabetes type II in his brother, brother, mother, and sister; Hypertension in his brother; Other in his father.  ROS:   Please see the history of present illness.    No complaints.  No medication side effects.  All other systems reviewed and are negative.  EKGs/Labs/Other Studies Reviewed:    The  following studies were reviewed today:  Last studies 2016  EKG:  EKG performed today demonstrates a normal tracing with heart rate 55 bpm (sinus bradycardia).  The tracing is otherwise normal.  When compared to January 2021, no significant changes occurred.  Recent Labs: 04/18/2019: ALT 42  Recent Lipid Panel    Component Value Date/Time   CHOL 85 (L) 04/18/2019 0805   TRIG 79 04/18/2019 0805   HDL 35 (L) 04/18/2019 0805   CHOLHDL 2.4 04/18/2019 0805   CHOLHDL 3.2 06/24/2015 1242   VLDL 12 06/24/2015 1242   LDLCALC 33 04/18/2019 0805    Physical Exam:    VS:  BP (!) 144/86   Pulse (!) 55   Ht 6' (1.829 m)   Wt 155 lb 12.8 oz (70.7 kg)   SpO2 100%   BMI 21.13 kg/m     Wt Readings from Last 3 Encounters:  03/25/20 155 lb 12.8 oz (70.7 kg)  07/28/19 163 lb (73.9 kg)  02/13/19 161 lb 3.2 oz (73.1 kg)     GEN: Slender and healthy in appearance. No acute distress HEENT: Normal NECK: No JVD. LYMPHATICS: No lymphadenopathy CARDIAC: No murmur. RRR no gallop, or edema. VASCULAR:  Normal Pulses. No bruits. RESPIRATORY:  Clear to auscultation without rales, wheezing or rhonchi  ABDOMEN: Soft, non-tender, non-distended, No pulsatile mass, MUSCULOSKELETAL: No deformity  SKIN: Warm and dry NEUROLOGIC:  Alert and oriented x 3 PSYCHIATRIC:  Normal affect   ASSESSMENT:    1. Coronary artery disease involving native coronary artery of native heart without angina pectoris   2. Essential hypertension   3. Chronic diastolic heart failure (HCC)   4. Other hyperlipidemia   5. Educated about COVID-19 virus infection    PLAN:    In order of problems listed above:  1. Secondary risk prevention discussed.  Lipid panel will be obtained today. 2. Blood pressure is elevated today but has not yet had Zestril.  Discussed low-salt diet 2.5 g/day.  Continue physical activity.  Blood pressure target less than 130/80 mmHg. 3. No clinical evidence of volume overload. 4. May decrease Crestor  to 20 mg/day if LDL is is low as it was last March. 5. Did not discuss.  Overall education and awareness concerning primary/secondary risk prevention was discussed in detail: LDL less than 70, hemoglobin A1c less than 7, blood pressure target less than 130/80 mmHg, >150 minutes of moderate aerobic activity per week, avoidance of smoking, weight control (via diet and exercise), and continued surveillance/management of/for obstructive sleep apnea.  Has requested that we authorize 6 months worth of medication prior to a trip home to [place] in June.  We will do what we can to help.  He now gets 22-month supply.  Medication Adjustments/Labs and Tests Ordered: Current medicines are reviewed at length with the  patient today.  Concerns regarding medicines are outlined above.  Orders Placed This Encounter  Procedures  . EKG 12-Lead   No orders of the defined types were placed in this encounter.   There are no Patient Instructions on file for this visit.   Signed, Sinclair Grooms, MD  03/25/2020 8:43 AM    [place]

## 2020-03-25 ENCOUNTER — Encounter: Payer: Self-pay | Admitting: Interventional Cardiology

## 2020-03-25 ENCOUNTER — Ambulatory Visit (INDEPENDENT_AMBULATORY_CARE_PROVIDER_SITE_OTHER): Payer: BC Managed Care – PPO | Admitting: Interventional Cardiology

## 2020-03-25 ENCOUNTER — Other Ambulatory Visit: Payer: Self-pay

## 2020-03-25 ENCOUNTER — Encounter (INDEPENDENT_AMBULATORY_CARE_PROVIDER_SITE_OTHER): Payer: Self-pay

## 2020-03-25 VITALS — BP 144/86 | HR 55 | Ht 72.0 in | Wt 155.8 lb

## 2020-03-25 DIAGNOSIS — I10 Essential (primary) hypertension: Secondary | ICD-10-CM

## 2020-03-25 DIAGNOSIS — R739 Hyperglycemia, unspecified: Secondary | ICD-10-CM | POA: Diagnosis not present

## 2020-03-25 DIAGNOSIS — I5032 Chronic diastolic (congestive) heart failure: Secondary | ICD-10-CM

## 2020-03-25 DIAGNOSIS — E7849 Other hyperlipidemia: Secondary | ICD-10-CM | POA: Diagnosis not present

## 2020-03-25 DIAGNOSIS — Z7189 Other specified counseling: Secondary | ICD-10-CM

## 2020-03-25 DIAGNOSIS — I2510 Atherosclerotic heart disease of native coronary artery without angina pectoris: Secondary | ICD-10-CM

## 2020-03-25 LAB — LIPID PANEL
Chol/HDL Ratio: 2.7 ratio (ref 0.0–5.0)
Cholesterol, Total: 118 mg/dL (ref 100–199)
HDL: 43 mg/dL (ref >39)
LDL Chol Calc (NIH): 61 mg/dL (ref 0–99)
Triglycerides: 68 mg/dL (ref 0–149)
VLDL Cholesterol Cal: 14 mg/dL (ref 5–40)

## 2020-03-25 LAB — HEMOGLOBIN A1C
Est. average glucose Bld gHb Est-mCnc: 111 mg/dL
Hgb A1c MFr Bld: 5.5 % (ref 4.8–5.6)

## 2020-03-25 LAB — HEPATIC FUNCTION PANEL
ALT: 20 IU/L (ref 0–44)
AST: 21 IU/L (ref 0–40)
Albumin: 4.6 g/dL (ref 3.8–4.9)
Alkaline Phosphatase: 69 IU/L (ref 44–121)
Bilirubin Total: 0.5 mg/dL (ref 0.0–1.2)
Bilirubin, Direct: 0.14 mg/dL (ref 0.00–0.40)
Total Protein: 7.2 g/dL (ref 6.0–8.5)

## 2020-03-25 LAB — BASIC METABOLIC PANEL
BUN/Creatinine Ratio: 22 — ABNORMAL HIGH (ref 9–20)
BUN: 21 mg/dL (ref 6–24)
CO2: 24 mmol/L (ref 20–29)
Calcium: 9.4 mg/dL (ref 8.7–10.2)
Chloride: 102 mmol/L (ref 96–106)
Creatinine, Ser: 0.94 mg/dL (ref 0.76–1.27)
Glucose: 98 mg/dL (ref 65–99)
Potassium: 4.2 mmol/L (ref 3.5–5.2)
Sodium: 139 mmol/L (ref 134–144)
eGFR: 98 mL/min/{1.73_m2} (ref >59)

## 2020-03-25 MED ORDER — LISINOPRIL 5 MG PO TABS: 5.0000 mg | ORAL_TABLET | Freq: Every day | ORAL | 3 refills | Status: DC

## 2020-03-25 MED ORDER — ROSUVASTATIN CALCIUM 40 MG PO TABS: 40.0000 mg | ORAL_TABLET | Freq: Every day | ORAL | 3 refills | Status: DC

## 2020-03-25 MED ORDER — NITROGLYCERIN 0.4 MG SL SUBL: 0.4000 mg | SUBLINGUAL_TABLET | SUBLINGUAL | 1 refills | Status: DC | PRN

## 2020-03-25 NOTE — Patient Instructions (Signed)
Medication Instructions:  Your physician recommends that you continue on your current medications as directed. Please refer to the Current Medication list given to you today.  *If you need a refill on your cardiac medications before your next appointment, please call your pharmacy*   Lab Work: BMET, Liver, Lipid and A1C today  If you have labs (blood work) drawn today and your tests are completely normal, you will receive your results only by: Marland Kitchen MyChart Message (if you have MyChart) OR . A paper copy in the mail If you have any lab test that is abnormal or we need to change your treatment, we will call you to review the results.   Testing/Procedures: None   Follow-Up: At [facility], you and your health needs are our priority.  As part of our continuing mission to provide you with exceptional heart care, we have created designated Provider Care Teams.  These Care Teams include your primary Cardiologist (physician) and Advanced Practice Providers (APPs -  Physician Assistants and Nurse Practitioners) who all work together to provide you with the care you need, when you need it.  We recommend signing up for the patient portal called "MyChart".  Sign up information is provided on this After Visit Summary.  MyChart is used to connect with patients for Virtual Visits (Telemedicine).  Patients are able to view lab/test results, encounter notes, upcoming appointments, etc.  Non-urgent messages can be sent to your provider as well.   To learn more about what you can do with MyChart, go to [email or web address].    Your next appointment:   1 year(s)  The format for your next appointment:   In Person  Provider:   You may see Sinclair Grooms, MD or one of the following Advanced Practice Providers on your designated Care Team:    Kathyrn Drown, NP    Other Instructions

## 2020-06-09 ENCOUNTER — Other Ambulatory Visit: Payer: Self-pay | Admitting: Interventional Cardiology

## 2020-07-16 DIAGNOSIS — Z03818 Encounter for observation for suspected exposure to other biological agents ruled out: Secondary | ICD-10-CM | POA: Diagnosis not present

## 2020-07-17 DIAGNOSIS — Z03818 Encounter for observation for suspected exposure to other biological agents ruled out: Secondary | ICD-10-CM | POA: Diagnosis not present

## 2020-07-18 DIAGNOSIS — Z03818 Encounter for observation for suspected exposure to other biological agents ruled out: Secondary | ICD-10-CM | POA: Diagnosis not present

## 2020-10-29 ENCOUNTER — Encounter: Payer: Self-pay | Admitting: Gastroenterology

## 2020-11-20 ENCOUNTER — Encounter: Payer: Self-pay | Admitting: Gastroenterology

## 2021-01-14 ENCOUNTER — Ambulatory Visit: Payer: BC Managed Care – PPO | Admitting: Gastroenterology

## 2021-01-14 ENCOUNTER — Encounter: Payer: Self-pay | Admitting: Gastroenterology

## 2021-01-14 VITALS — BP 140/90 | HR 65 | Ht 72.0 in | Wt 154.5 lb

## 2021-01-14 DIAGNOSIS — Z86010 Personal history of colonic polyps: Secondary | ICD-10-CM

## 2021-01-14 DIAGNOSIS — R140 Abdominal distension (gaseous): Secondary | ICD-10-CM

## 2021-01-14 DIAGNOSIS — A048 Other specified bacterial intestinal infections: Secondary | ICD-10-CM

## 2021-01-14 DIAGNOSIS — K31A19 Gastric intestinal metaplasia without dysplasia, unspecified site: Secondary | ICD-10-CM | POA: Diagnosis not present

## 2021-01-14 MED ORDER — PLENVU 140 G PO SOLR: 1.0000 | Freq: Once | ORAL | 0 refills | Status: AC

## 2021-01-14 NOTE — Patient Instructions (Addendum)
You have been scheduled for an endoscopy and colonoscopy. Please follow the written instructions given to you at your visit today. Please pick up your prep supplies at the pharmacy within the next 1-3 days. If you use inhalers (even only as needed), please bring them with you on the day of your procedure. ___________________________________________________  Food Guidelines for those with chronic digestive trouble:  Many people have difficulty digesting certain foods, causing a variety of distressing and embarrassing symptoms such as abdominal pain, bloating and gas.  These foods may need to be avoided or consumed in small amounts.  Here are some tips that might be helpful for you.  1.   Lactose intolerance is the difficulty or complete inability to digest lactose, the natural sugar in milk and anything made from milk.  This condition is harmless, common, and can begin any time during life.  Some people can digest a modest amount of lactose while others cannot tolerate any.  Also, not all dairy products contain equal amounts of lactose.  For example, hard cheeses such as parmesan have less lactose than soft cheeses such as cheddar.  Yogurt has less lactose than milk or cheese.  Many packaged foods (even many brands of bread) have milk, so read ingredient lists carefully.  It is difficult to test for lactose intolerance, so just try avoiding lactose as much as possible for a week and see what happens with your symptoms.  If you seem to be lactose intolerant, the best plan is to avoid it (but make sure you get calcium from another source).  The next best thing is to use lactase enzyme supplements, available over the counter everywhere.  Just know that many lactose intolerant people need to take several tablets with each serving of dairy to avoid symptoms.  Lastly, a lot of restaurant food is made with milk or butter.  Many are things you might not suspect, such as mashed potatoes, rice and pasta (cooked with  butter) and "grilled" items.  If you are lactose intolerant, it never hurts to ask your server what has milk or butter.  2.   Fiber is an important part of your diet, but not all fiber is well-tolerated.  Insoluble fiber such as bran is often consumed by normal gut bacteria and converted into gas.  Soluble fiber such as oats, squash, carrots and green beans are typically tolerated better.  3.   Some types of carbohydrates can be poorly digested.  Examples include: fructose (apples, cherries, pears, raisins and other dried fruits), fructans (onions, zucchini, large amounts of wheat), sorbitol/mannitol/xylitol and sucralose/Splenda (common artificial sweeteners), and raffinose (lentils, broccoli, cabbage, asparagus, brussel sprouts, many types of beans).  Do a Development worker, community for [employer] and you will find helpful information. Beano, a dietary supplement, will often help with raffinose-containing foods.  As with lactase tablets, you may need several per serving.  4.   Whenever possible, avoid processed food&meats and chemical additives.  High fructose corn syrup, a common sweetener, may be difficult to digest.  Eggs and soy (comes from the soybean, and added to many foods now) are other common bloating/gassy foods.  5.  Regarding gluten:  gluten is a protein mainly found in wheat, but also rye and barley.  There is a condition called celiac sprue, which is an inflammatory reaction in the small intestine causing a variety of digestive symptoms.  Blood testing is highly reliable to look for this condition, and sometimes upper endoscopy with small bowel biopsies may be  necessary to make the diagnosis.  Many patients who test negative for celiac sprue report improvement in their digestive symptoms when they switch to a gluten-free diet.  However, in these "non-celiac gluten sensitive" patients, the true role of gluten in their symptoms is unclear.  Reducing carbohydrates in general may decrease the gas and  bloating caused when gut bacteria consume carbs. Also, some of these patients may actually be intolerant of the baker's yeast in bread products rather than the gluten.  Flatbread and other reduced yeast breads might therefore be tolerated.  There is no specific testing available for most food intolerances, which are discovered mainly by dietary elimination.  Please do not embark on a gluten free diet unless directed by your doctor, as it is highly restrictive, and may lead to nutritional deficiencies if not carefully monitored.  Lastly, beware of internet claims offering "personalized" tests for food intolerances.  Such testing has no reliable scientific evidence to support its reliability and correlation to symptoms.    6.  The best advice is old advice, especially for those with chronic digestive trouble - try to eat "clean".  Balanced diet, avoid processed food, plenty of fruits and vegetables, cut down the sugar, minimal alcohol, avoid tobacco. Make time to care for yourself, get enough sleep, exercise when you can, reduce stress.  Your guts will thank you for it.   - Dr. [facility]  __________________________________________________________

## 2021-01-14 NOTE — Progress Notes (Signed)
[facility] Consult Note:  History: Jeremy Vazquez 01/14/2021  Referring provider: Patient, No Pcp Per (Inactive)  Reason for consult/chief complaint: Gas (Has always had a lot of gas when he eats. Feels like a balloon when he pushes on his stomach above his belly button)   Subjective  HPI: Jeremy Vazquez was seen in September 2017 for generalized abdominal pain.  EGD and colonoscopy performed.  EGD biopsies revealed H. pylori focal intestinal metaplasia, treated with bismuth based quadruple therapy. Colonoscopy complete to the cecum with excellent preparation, 4 mm cecal tubular adenoma removed.  There was an incidental left colon venous abnormality indicated in report. Jeremy Vazquez has the same symptoms as when I saw him in 2017, characterized by bloating and a feeling of excess gas and flatus.  He feels as if the upper abdomen is somewhat protuberant as a result.  Denies dysphagia, early satiety, anorexia, nausea vomiting or weight loss.  Bowel habits are regular without constipation or loose stool, denies rectal bleeding as well.  The symptoms have been present for his entire life and he recalls his father had similar digestive issues.  He is wondering if it may be related to certain foods. When he was home in [place] several months ago, he [place] a physician in [place], had some kind of abdominal scan and was told he had an infection and was given an unknown medicine.  He felt better for short while only to have symptoms return as before.  Cardiology office note from March of this year indicates patient without recurrent anginal symptoms and good functional activity.  Acute MI 2016 from high-grade LAD lesion treated with DES.  Decreased EF during that episode, found to have recovered on later echocardiograms. ROS:  Review of Systems  Constitutional:  Negative for appetite change and unexpected weight change.  HENT:  Negative for mouth sores and voice change.   Eyes:   Negative for pain and redness.  Respiratory:  Negative for cough and shortness of breath.   Cardiovascular:  Negative for chest pain and palpitations.  Genitourinary:  Negative for dysuria and hematuria.  Musculoskeletal:  Negative for arthralgias and myalgias.  Skin:  Negative for pallor and rash.  Neurological:  Negative for weakness and headaches.  Hematological:  Negative for adenopathy.    Past Medical History: Past Medical History:  Diagnosis Date   CAD (coronary artery disease)    a. STEMI 05/2014 - s/p DES to LAD.   CAD (coronary artery disease), native coronary artery 09/26/2014   Proximal LAD stent.    Chronic diastolic heart failure ([facility]) 09/26/2014   Postinfarct ischemic cardiomyopathy initial EF during the acute MI 35% recovered to 50-55% post reperfusion. May 2016    Dyslipidemia    Essential hypertension, benign 01/03/2013   GERD (gastroesophageal reflux disease) 06/29/2013   Hyperlipidemia 06/08/2014   Hypertension    Ischemic cardiomyopathy    a. EF 35-40% by cath at time of STEMI 05/2014.   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 Acute EF 35% recovered to 55% post intervention.      Past Surgical History: Past Surgical History:  Procedure Laterality Date   CARDIAC CATHETERIZATION N/A 06/08/2014   Procedure: Left Heart Cath and Coronary Angiography;  Surgeon: Belva Crome, MD;  Location: [facility];  Service: Cardiovascular;  Laterality: N/A;   CARDIAC CATHETERIZATION N/A 06/08/2014   Procedure: Coronary Stent Intervention;  Surgeon: Mallie Mussel  Nicholes Stairs, MD;  Location: [facility];  Service: Cardiovascular;  Laterality: N/A;     Family History: Family History  Problem Relation Age of Onset   Diabetes type II Mother    Other Father        STOMACH ISSUES   Diabetes type II Sister    Diabetes type II Brother    Hypertension Brother    Diabetes type II Brother     Social  History: Social History   Socioeconomic History   Marital status: Married    Spouse name: Not on file   Number of children: 7   Years of education: Not on file   Highest education level: Not on file  Occupational History   Occupation: [employer] education officer  Tobacco Use   Smoking status: Never   Smokeless tobacco: Never  Vaping Use   Vaping Use: Never used  Substance and Sexual Activity   Alcohol use: No   Drug use: No   Sexual activity: Not on file  Other Topics Concern   Not on file  Social History Narrative   Not on file   Social Determinants of Health   Financial Resource Strain: Not on file  Food Insecurity: Not on file  Transportation Needs: Not on file  Physical Activity: Not on file  Stress: Not on file  Social Connections: Not on file    Allergies: No Known Allergies  Outpatient Meds: Current Outpatient Medications  Medication Sig Dispense Refill   EQ ASPIRIN ADULT LOW DOSE 81 MG EC tablet Take 1 tablet by mouth once daily 120 tablet 0   lisinopril (ZESTRIL) 5 MG tablet Take 1 tablet (5 mg total) by mouth daily. 90 tablet 3   rosuvastatin (CRESTOR) 40 MG tablet Take 1 tablet (40 mg total) by mouth daily. 90 tablet 3   No current facility-administered medications for this visit.      ___________________________________________________________________ Objective   Exam:  BP 140/90    Pulse 65    Ht 6' (1.829 m)    Wt 154 lb 8 oz (70.1 kg)    BMI 20.95 kg/m  Wt Readings from Last 3 Encounters:  01/14/21 154 lb 8 oz (70.1 kg)  03/25/20 155 lb 12.8 oz (70.7 kg)  07/28/19 163 lb (73.9 kg)    General: He is well-appearing, thin, no muscle wasting Eyes: sclera anicteric, no redness ENT: oral mucosa moist without lesions, no cervical or supraclavicular lymphadenopathy CV: RRR without murmur, S1/S2, no JVD, no peripheral edema Resp: clear to auscultation bilaterally, normal RR and effort noted GI: soft, no tenderness, with active bowel sounds. No guarding  or palpable organomegaly noted.  No protuberance of the abdominal wall standing or supine Skin; warm and dry, no rash or jaundice noted Neuro: awake, alert and oriented x 3. Normal gross motor function and fluent speech  Labs:  LVEF 55 to 60% on July 2016 echo.  Pathology reports from 2017  1. 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 - NO DYSPLASIA OR MALIGNANCY. 2. Surgical [P], cecum, polyp - TUBULAR ADENOMA. - NO HIGH GRADE DYSPLASIA OR MALIGNANCY.  Assessment: Encounter Diagnoses  Name Primary?   H. 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  Looks and feels well.  History of coronary disease, stable, very active. I  reassured him the symptoms seem benign and may be unrelated to the previously discovered H. pylori.  Seems likely some maldigestion, some written dietary advice was given.  He needs an upper endoscopy for gastric mapping biopsies due to previous finding of intestinal metaplasia, and on these biopsies we will also see if he has persistent H. pylori. Colonoscopy for history of colon polyp. He was agreeable after discussion of procedure and risks.  The benefits and risks of the planned procedure were described in detail with the patient or (when appropriate) their health care proxy.  Risks were outlined as including, but not limited to, bleeding, infection, perforation, adverse medication reaction leading to cardiac or pulmonary decompensation, pancreatitis (if ERCP).  The limitation of incomplete mucosal visualization was also discussed.  No guarantees or warranties were given.   Thank you for the courtesy of this consult.  Please call me with any questions or concerns.  Nelida Meuse III  CC: Referring provider noted  above

## 2021-01-15 ENCOUNTER — Encounter: Payer: Self-pay | Admitting: Gastroenterology

## 2021-01-15 ENCOUNTER — Other Ambulatory Visit: Payer: Self-pay

## 2021-01-15 ENCOUNTER — Ambulatory Visit (AMBULATORY_SURGERY_CENTER): Payer: BC Managed Care – PPO | Admitting: Gastroenterology

## 2021-01-15 VITALS — BP 114/54 | HR 80 | Temp 96.6°F | Resp 14 | Ht 72.0 in | Wt 154.0 lb

## 2021-01-15 DIAGNOSIS — B9681 Helicobacter pylori [H. pylori] as the cause of diseases classified elsewhere: Secondary | ICD-10-CM | POA: Diagnosis not present

## 2021-01-15 DIAGNOSIS — Z860100 Personal history of colon polyps, unspecified: Secondary | ICD-10-CM

## 2021-01-15 DIAGNOSIS — K2970 Gastritis, unspecified, without bleeding: Secondary | ICD-10-CM

## 2021-01-15 DIAGNOSIS — K31A19 Gastric intestinal metaplasia without dysplasia, unspecified site: Secondary | ICD-10-CM

## 2021-01-15 DIAGNOSIS — K2950 Unspecified chronic gastritis without bleeding: Secondary | ICD-10-CM | POA: Diagnosis not present

## 2021-01-15 DIAGNOSIS — A048 Other specified bacterial intestinal infections: Secondary | ICD-10-CM

## 2021-01-15 MED ORDER — SODIUM CHLORIDE 0.9 % IV SOLN: 500.0000 mL | Freq: Once | INTRAVENOUS | Status: AC

## 2021-01-15 NOTE — Op Note (Signed)
[facility] Patient Name: Gorman Safi Procedure Date: 01/15/2021 10:46 AM MRN: [id number] Endoscopist: Mallie Mussel L. Loletha Carrow , MD Age: 50 Referring MD:  Date of Birth: [birth year] Gender: Male Account #: [id number] Procedure:                Upper GI endoscopy Indications:              Previously treated for Helicobacter pylori,                            Follow-up of gastric intestinal metaplasia                            discovered incidentally in 2017                           Bloating Medicines:                Monitored Anesthesia Care Procedure:                Pre-Anesthesia Assessment:                           - Prior to the procedure, a History and Physical                            was performed, and patient medications and                            allergies were reviewed. The patient's tolerance of                            previous anesthesia was also reviewed. The risks                            and benefits of the procedure and the sedation                            options and risks were discussed with the patient.                            All questions were answered, and informed consent                            was obtained. Prior Anticoagulants: The patient has                            taken no previous anticoagulant or antiplatelet                            agents. ASA Grade Assessment: II - A patient with                            mild systemic disease. After reviewing the risks  and benefits, the patient was deemed in                            satisfactory condition to undergo the procedure.                           After obtaining informed consent, the endoscope was                            passed under direct vision. Throughout the                            procedure, the patient's blood pressure, pulse, and                            oxygen saturations were monitored continuously. The                             GIF [id number] #[id number] was introduced through the                            mouth, and advanced to the second part of duodenum.                            The upper GI endoscopy was accomplished without                            difficulty. The patient tolerated the procedure                            well. Scope In: Scope Out: Findings:                 The larynx was normal.                           The esophagus was normal.                           Diffuse atrophic mucosa was found in the gastric                            antrum. Otherwise, no mucosal abnormalities seen                            under WL and NBI. No raised or suspicious areas.                            Gastric mapping Bx taken. Several biopsies were                            obtained on the greater curvature of the stomach                            (Jar 4), on the lesser curvature of  the stomach                            (Jar 5), at the incisura (Jar 3), on the greater                            curvature of the gastric antrum(Jar 1) and on the                            lesser curvature of the gastric antrum (Jar 2) with                            cold forceps for histology.                           The cardia and gastric fundus were normal on                            retroflexion.                           The examined duodenum was normal. Complications:            No immediate complications. Estimated Blood Loss:     Estimated blood loss: none. Estimated blood loss                            was minimal. Impression:               - Normal larynx.                           - Normal esophagus.                           - Gastric mucosal atrophy.                           - Normal examined duodenum.                           - Several biopsies were obtained on the greater                            curvature of the stomach, on the lesser curvature                            of the stomach, at the incisura,  on the greater                            curvature of the gastric antrum and on the lesser                            curvature of the gastric antrum. Recommendation:           - Patient has a contact number available for  emergencies. The signs and symptoms of potential                            delayed complications were discussed with the                            patient. Return to normal activities tomorrow.                            Written discharge instructions were provided to the                            patient.                           - Resume previous diet.                           - Continue present medications.                           - Await pathology results.                           - See the other procedure note for documentation of                            additional recommendations. Katelynd Blauvelt L. Loletha Carrow, MD 01/15/2021 11:38:41 AM This report has been signed electronically.

## 2021-01-15 NOTE — Progress Notes (Signed)
VS by [facility]  Pt's states no medical or surgical changes since previsit or office visit.

## 2021-01-15 NOTE — Op Note (Signed)
[facility] Patient Name: Jeremy Vazquez Procedure Date: 01/15/2021 10:46 AM MRN: [id number] Endoscopist: Mallie Mussel L. Loletha Carrow , MD Age: 50 Referring MD:  Date of Birth: 03/04/1970 Gender: Male Account #: [id number] Procedure:                Colonoscopy Indications:              Surveillance: Personal history of adenomatous                            polyps on last colonoscopy 5 years ago                           <58mm cecal TA 2017 Medicines:                Monitored Anesthesia Care Procedure:                Pre-Anesthesia Assessment:                           - Prior to the procedure, a History and Physical                            was performed, and patient medications and                            allergies were reviewed. The patient's tolerance of                            previous anesthesia was also reviewed. The risks                            and benefits of the procedure and the sedation                            options and risks were discussed with the patient.                            All questions were answered, and informed consent                            was obtained. Prior Anticoagulants: The patient has                            taken no previous anticoagulant or antiplatelet                            agents. ASA Grade Assessment: II - A patient with                            mild systemic disease. After reviewing the risks                            and benefits, the patient was deemed in  satisfactory condition to undergo the procedure.                           After obtaining informed consent, the colonoscope                            was passed under direct vision. Throughout the                            procedure, the patient's blood pressure, pulse, and                            oxygen saturations were monitored continuously. The                            Colonoscope was introduced through the anus and                             advanced to the the cecum, identified by                            appendiceal orifice and ileocecal valve. The                            colonoscopy was performed without difficulty. The                            patient tolerated the procedure well. The quality                            of the bowel preparation was excellent. The                            ileocecal valve, appendiceal orifice, and rectum                            were photographed. The bowel preparation used was                            Plenvu. Scope In: 10:59:03 AM Scope Out: 11:09:39 AM Scope Withdrawal Time: 0 hours 6 minutes 59 seconds  Total Procedure Duration: 0 hours 10 minutes 36 seconds  Findings:                 The perianal and digital rectal examinations were                            normal.                           There is no endoscopic evidence of polyps in the                            entire colon.                           -  There was a nest of dilated submucosal veins in                            the descending colon. Similar appearance to last                            exam. (Benign incidental finding)                           Internal hemorrhoids were found. The hemorrhoids                            were small.                           The exam was otherwise without abnormality on                            direct and retroflexion views. Complications:            No immediate complications. Estimated Blood Loss:     Estimated blood loss: none. Impression:               - Internal hemorrhoids. 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                           - The examination was otherwise normal on direct                            and retroflexion views.                           - No specimens collected. Recommendation:           - Patient has a contact number available for                             emergencies. The signs and symptoms of potential                            delayed complications were discussed with the                            patient. Return to normal activities tomorrow.                            Written discharge instructions were provided to the                            patient.                           - Resume previous diet.                           -  Continue present medications.                           - Repeat colonoscopy in 10 years for screening                            purposes.                           - See the other procedure note for documentation of                            additional recommendations. Edwardo Wojnarowski L. Loletha Carrow, MD 01/15/2021 11:32:57 AM This report has been signed electronically.

## 2021-01-15 NOTE — Progress Notes (Signed)
Report given to PACU, vss 

## 2021-01-15 NOTE — Progress Notes (Signed)
No changes to clinical history since GI office visit on 01/09/21.  The patient is appropriate for an endoscopic procedure in the ambulatory setting.

## 2021-01-15 NOTE — Patient Instructions (Addendum)
Information on hemorrhoids given to you today.  Await pathology results from the biopsies taken today.  Repeat colonoscopy in 10 years for screening purposes.  Resume previous diet and medications.   YOU HAD AN ENDOSCOPIC PROCEDURE TODAY AT [facility]:   Refer to the procedure report that was given to you for any specific questions about what was found during the examination.  If the procedure report does not answer your questions, please call your gastroenterologist to clarify.  If you requested that your care partner not be given the details of your procedure findings, then the procedure report has been included in a sealed envelope for you to review at your convenience later.  YOU SHOULD EXPECT: Some feelings of bloating in the abdomen. Passage of more gas than usual.  Walking can help get rid of the air that was put into your GI tract during the procedure and reduce the bloating. If you had a lower endoscopy (such as a colonoscopy or flexible sigmoidoscopy) you may notice spotting of blood in your stool or on the toilet paper. If you underwent a bowel prep for your procedure, you may not have a normal bowel movement for a few days.  Please Note:  You might notice some irritation and congestion in your nose or some drainage.  This is from the oxygen used during your procedure.  There is no need for concern and it should clear up in a day or so.  SYMPTOMS TO REPORT IMMEDIATELY:  Following lower endoscopy (colonoscopy or flexible sigmoidoscopy):  Excessive amounts of blood in the stool  Significant tenderness or worsening of abdominal pains  Swelling of the abdomen that is new, acute  Fever of 100F or higher  Following upper endoscopy (EGD)  Vomiting of blood or coffee ground material  New chest pain or pain under the shoulder blades  Painful or persistently difficult swallowing  New shortness of breath  Fever of 100F or higher  Black, tarry-looking stools  For  urgent or emergent issues, a gastroenterologist can be reached at any hour by calling [phone]. Do not use MyChart messaging for urgent concerns.    DIET:  We do recommend a small meal at first, but then you may proceed to your regular diet.  Drink plenty of fluids but you should avoid alcoholic beverages for 24 hours.  ACTIVITY:  You should plan to take it easy for the rest of today and you should NOT DRIVE or use heavy machinery until tomorrow (because of the sedation medicines used during the test).    FOLLOW UP: Our staff will call the number listed on your records 48-72 hours following your procedure to check on you and address any questions or concerns that you may have regarding the information given to you following your procedure. If we do not reach you, we will leave a message.  We will attempt to reach you two times.  During this call, we will ask if you have developed any symptoms of COVID 19. If you develop any symptoms (ie: fever, flu-like symptoms, shortness of breath, cough etc.) before then, please call [phone].  If you test positive for Covid 19 in the 2 weeks post procedure, please call and report this information to [place].    If any biopsies were taken you will be contacted by phone or by letter within the next 1-3 weeks.  Please call us at [phone] if you have not heard about the biopsies in 3 weeks.  SIGNATURES/CONFIDENTIALITY: You and/or your care partner have signed paperwork which will be entered into your electronic medical record.  These signatures attest to the fact that that the information above on your After Visit Summary has been reviewed and is understood.  Full responsibility of the confidentiality of this discharge information lies with you and/or your care-partner.

## 2021-01-15 NOTE — Progress Notes (Signed)
1121 Ephedrine 10 mg given IV due to low BP, MD updated.

## 2021-01-15 NOTE — Progress Notes (Signed)
Called to room to assist during endoscopic procedure.  Patient ID and intended procedure confirmed with present staff. Received instructions for my participation in the procedure from the performing physician.  

## 2021-01-15 NOTE — Progress Notes (Signed)
1054 Robinul 0.1 mg IV given due large amount of secretions upon assessment.  MD made aware, vss

## 2021-01-15 NOTE — Progress Notes (Signed)
1107 Ephedrine 10 mg given IV due to low BP, MD updated.

## 2021-01-17 ENCOUNTER — Telehealth: Payer: Self-pay

## 2021-01-17 NOTE — Telephone Encounter (Signed)
°  Follow up Call-  Call back number 01/15/2021  Post procedure Call Back phone  # [phone]  Permission to leave phone message Yes  Some recent data might be hidden     Patient questions:  Do you have a fever, pain , or abdominal swelling? No. Pain Score  0 *  Have you tolerated food without any problems? Yes.    Have you been able to return to your normal activities? Yes.    Do you have any questions about your discharge instructions: Diet   No. Medications  No. Follow up visit  No.  Do you have questions or concerns about your Care? No.  Actions: * If pain score is 4 or above: No action needed, pain <4.  Have you developed a fever since your procedure? no  2.   Have you had an respiratory symptoms (SOB or cough) since your procedure? no  3.   Have you tested positive for COVID 19 since your procedure no  4.   Have you had any family members/close contacts diagnosed with the COVID 19 since your procedure?  no   If yes to any of these questions please route to Joylene John, RN and Joella Prince, RN

## 2021-01-22 ENCOUNTER — Other Ambulatory Visit: Payer: Self-pay

## 2021-01-22 MED ORDER — TALICIA 250-12.5-10 MG PO CPDR: 4.0000 | DELAYED_RELEASE_CAPSULE | Freq: Three times a day (TID) | ORAL | 0 refills | Status: DC

## 2021-01-28 ENCOUNTER — Telehealth: Payer: Self-pay

## 2021-01-28 MED ORDER — TALICIA 250-12.5-10 MG PO CPDR: 4.0000 | DELAYED_RELEASE_CAPSULE | Freq: Three times a day (TID) | ORAL | 0 refills | Status: DC

## 2021-01-28 NOTE — Telephone Encounter (Signed)
Received a fax from [facility]. Talicia prescription was approved, but [facility] is unable to fill the medication. The prescription will need to be transferred to [facility] (P: [phone], F: [phone]). E-scribe failed twice. RX was printed and faxed to the requested pharmacy.   Called and spoke with patient in regards to this information. He knows to expect a call from [facility] to set up shipment of his medication. Pt verbalized understanding and had no concerns at the end of the call.

## 2021-03-04 ENCOUNTER — Telehealth: Payer: Self-pay

## 2021-03-04 DIAGNOSIS — A048 Other specified bacterial intestinal infections: Secondary | ICD-10-CM

## 2021-03-04 NOTE — Telephone Encounter (Signed)
-----   Message from Yevette Edwards, RN sent at 01/22/2021 12:45 PM EST ----- Regarding: Urea breath test Urea breath test due on/after 2/17 Mail patient information

## 2021-03-04 NOTE — Telephone Encounter (Signed)
Letter, written order, and [facility] office information mailed to patient.  Urea breath test order in epic also.

## 2021-03-06 ENCOUNTER — Other Ambulatory Visit: Payer: Self-pay | Admitting: Interventional Cardiology

## 2021-03-10 ENCOUNTER — Telehealth: Payer: Self-pay | Admitting: Gastroenterology

## 2021-03-10 NOTE — Telephone Encounter (Signed)
Call placed to the pt- Jeremy Vazquez was sent to [facility] we need to know if the pt ever responded to communication from [facility].  Left message on machine to call back

## 2021-03-10 NOTE — Telephone Encounter (Signed)
Patient called stating he never received his prescription for Omeprazole or Pantoprazole and because of that he has never gone to get his H-Pylori test done.  Please call patient and advise.  Thank you.

## 2021-03-11 NOTE — Telephone Encounter (Signed)
Lm on vm for patient to return call 

## 2021-03-14 ENCOUNTER — Other Ambulatory Visit: Payer: Self-pay

## 2021-03-14 DIAGNOSIS — A048 Other specified bacterial intestinal infections: Secondary | ICD-10-CM

## 2021-03-14 MED ORDER — TALICIA 250-12.5-10 MG PO CPDR: 4.0000 | DELAYED_RELEASE_CAPSULE | Freq: Three times a day (TID) | ORAL | 0 refills | Status: AC

## 2021-03-14 NOTE — Telephone Encounter (Signed)
Returned call to patient, he states that he never received the [place] prescription. I told him that I would call the pharmacy and check on this for him.   I spoke with Judson Roch at [facility] and she stated that she didn't see Pat Kocher in their system or the patient. I have sent a new prescription to [facility]. Will call and check to confirm that prescription was received.

## 2021-03-14 NOTE — Telephone Encounter (Signed)
Called and spoke with Merrilee Seashore at [facility]. He did confirm that they have received the RX for Talicia. Merrilee Seashore told me that it takes 72 hours to process new prescriptions. They will contact the pt to set up shipment of the medication. I called the patient to inform him of this information. He knows to expect a call from the pharmacy. He knows that he will need to complete the breath test 2-3 weeks after he finishes the antibiotics. Pt verbalized understanding and had no concerns at the end of the call.   New reminder in epic.

## 2021-04-16 ENCOUNTER — Telehealth: Payer: Self-pay

## 2021-04-16 NOTE — Telephone Encounter (Signed)
Letter mailed to patient with lab reminder, [place] office location, and written order for H. Pylori breath test. ?

## 2021-04-16 NOTE — Telephone Encounter (Signed)
-----   Message from Yevette Edwards, RN sent at 03/14/2021 11:00 AM EST ----- ?Regarding: Urea breath test ?Urea breath test due on/after 4/5 ?Mail patient information ? ?

## 2021-04-22 ENCOUNTER — Telehealth: Payer: Self-pay | Admitting: Gastroenterology

## 2021-04-22 NOTE — Telephone Encounter (Signed)
Patient called stating that he was supposed to receive medications in the mail but has not received them. Per patient, he doesn't know the name of the medications. Please advise.  ?

## 2021-04-24 NOTE — Telephone Encounter (Signed)
I called and spoke to the patient. He never received the Talica for treatment of H.Pylori. He states he never heard from the pharmacy. He has recently changed jobs and doesn't have insurance currently. Is there an alternative for treatment that would be cost effective for a cash pay patient.  ?

## 2021-04-29 NOTE — Telephone Encounter (Signed)
Left message to return call 

## 2021-04-29 NOTE — Telephone Encounter (Signed)
One of the Talicia component antibiotics (rifabutin) is not available in the correct dose if the components were to be prescribed individually. ? ? ?Despite possible H pylori resistance to clarithromycin, then next line therapy for him would be ? ?Omeprazole 20 mg tablet: one tablet twice daily x 14 days ?Amoxicillin '500mg'$ :  two tablets twice daily x 14 days ?Clarithromycin 500 mg tablet:  one tablet twice daily x 14 days ? ?Take all three meds at the same time twice daily. ? ?Then repeat UBT 4-5 weeks after completing therapy. ? ?- HD ?

## 2021-05-15 NOTE — Telephone Encounter (Signed)
Patient states he will have insurance soon (May 19, 2021) he will wait till he has insurance before he tries to fill these medications. ?

## 2021-05-26 ENCOUNTER — Other Ambulatory Visit: Payer: Self-pay

## 2021-05-26 ENCOUNTER — Telehealth: Payer: Self-pay | Admitting: Interventional Cardiology

## 2021-05-26 MED ORDER — LISINOPRIL 5 MG PO TABS: 5.0000 mg | ORAL_TABLET | Freq: Every day | ORAL | 0 refills | Status: DC

## 2021-05-26 NOTE — Telephone Encounter (Signed)
?*  STAT* If patient is at the pharmacy, call can be transferred to refill team. ? ? ?1. Which medications need to be refilled? (please list name of each medication and dose if known)  ?lisinopril (ZESTRIL) 5 MG tablet ? ?2. Which pharmacy/location (including street and city if local pharmacy) is medication to be sent to? ?[place], [place]. ? ?3. Do they need a 30 day or 90 day supply? 90 with refills ? ? ?Patient is scheduled to see Dr. Tamala Julian 08/11/21 ?

## 2021-05-27 MED ORDER — AMOXICILLIN 500 MG PO TABS: 1000.0000 mg | ORAL_TABLET | Freq: Two times a day (BID) | ORAL | 0 refills | Status: DC

## 2021-05-27 MED ORDER — OMEPRAZOLE 20 MG PO CPDR: 20.0000 mg | DELAYED_RELEASE_CAPSULE | Freq: Two times a day (BID) | ORAL | 0 refills | Status: DC

## 2021-05-27 MED ORDER — CLARITHROMYCIN 500 MG PO TABS: 500.0000 mg | ORAL_TABLET | Freq: Two times a day (BID) | ORAL | 0 refills | Status: AC

## 2021-05-27 NOTE — Telephone Encounter (Signed)
Spoke to [employer]. He now has his insurance and will pick up the treatment. Medications has been sent to [facility] on [address]. ?

## 2021-06-23 ENCOUNTER — Other Ambulatory Visit: Payer: Self-pay

## 2021-08-09 NOTE — Progress Notes (Unsigned)
Cardiology Office Note:    Date:  08/11/2021   ID:  Jeremy Vazquez, DOB [birth year], MRN [id number]  PCP:  Patient, No Pcp Per  Cardiologist:  Sinclair Grooms, MD   Referring MD: No ref. provider found   Chief Complaint  Patient presents with   Coronary Artery Disease   Hyperlipidemia    History of Present Illness:    Jeremy Vazquez is a 51 y.o. male with a hx of CAD with recent history of LAD STEMI (DES mid vessel), transient acute systolic heart failure resolved to diastolic heart failure with EF greater than 50%, essential hypertension, and hyperlipidemia.    He is doing well.  No angina.  Not had syncope or lower extremity swelling.  Has started taking rosuvastatin Monday Wednesday and Friday.  No recent laboratory data.  No symptoms of claudication.  Play soccer with his grandkids without difficulty.  He is physically active with them at least 1 hour 5 days of the week.  Past Medical History:  Diagnosis Date   CAD (coronary artery disease)    a. STEMI 05/2014 - s/p DES to LAD.   CAD (coronary artery disease), native coronary artery 09/26/2014   Proximal LAD stent.    Chronic diastolic heart failure ([place]) 09/26/2014   Postinfarct ischemic cardiomyopathy initial EF during the acute MI 35% recovered to 50-55% post reperfusion. May 2016    Dyslipidemia    Essential hypertension, benign 01/03/2013   GERD (gastroesophageal reflux disease) 06/29/2013   Hyperlipidemia 06/08/2014   Hypertension    Ischemic cardiomyopathy    a. EF 35-40% by cath at time of STEMI 05/2014.   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 Acute EF 35% recovered to 55% post intervention.     Past Surgical History:  Procedure Laterality Date   CARDIAC CATHETERIZATION N/A 06/08/2014   Procedure: Left Heart Cath and Coronary Angiography;  Surgeon: Belva Crome, MD;  Location: [place] [facility];  Service: Cardiovascular;   Laterality: N/A;   CARDIAC CATHETERIZATION N/A 06/08/2014   Procedure: Coronary Stent Intervention;  Surgeon: Belva Crome, MD;  Location: [place] [facility];  Service: Cardiovascular;  Laterality: N/A;    Current Medications: Current Meds  Medication Sig   EQ ASPIRIN ADULT LOW DOSE 81 MG EC tablet Take 1 tablet by mouth once daily   [DISCONTINUED] lisinopril (ZESTRIL) 5 MG tablet Take 1 tablet (5 mg total) by mouth daily. Please schedule yearly appointment for future refills. 1st attempt. Thank you   [DISCONTINUED] rosuvastatin (CRESTOR) 40 MG tablet Take 1 tablet (40 mg total) by mouth daily.   Current Facility-Administered Medications for the 08/11/21 encounter (Office Visit) with Belva Crome, MD  Medication   0.9 %  sodium chloride infusion     Allergies:   Patient has no known allergies.   Social History   Socioeconomic History   Marital status: Married    Spouse name: Not on file   Number of children: 7   Years of education: Not on file   Highest education level: Not on file  Occupational History   Occupation: [employer]  Tobacco Use   Smoking status: Never   Smokeless tobacco: Never  Vaping Use   Vaping Use: Never used  Substance and Sexual Activity   Alcohol use: No   Drug use: No   Sexual activity: Not on file  Other Topics Concern   Not  on file  Social History Narrative   Not on file   Social Determinants of Health   Financial Resource Strain: Not on file  Food Insecurity: Not on file  Transportation Needs: Not on file  Physical Activity: Not on file  Stress: Not on file  Social Connections: Not on file     Family History: The patient's family history includes Diabetes type II in his brother, brother, mother, and sister; Hypertension in his brother; Other in his father.  ROS:   Please see the history of present illness.    No complaints all other systems reviewed and are negative.  EKGs/Labs/Other Studies Reviewed:    The following  studies were reviewed today: No recent imaging.  EKG:  EKG ON 03/25/2020, SB with otherwise normal appearance.  The tracing performed today, 08/11/2021 demonstrates sinus rhythm with nonspecific ST abnormality.  No significant change.    Recent Labs: No results found for requested labs within last 365 days.  Recent Lipid Panel    Component Value Date/Time   CHOL 118 03/25/2020 0901   TRIG 68 03/25/2020 0901   HDL 43 03/25/2020 0901   CHOLHDL 2.7 03/25/2020 0901   CHOLHDL 3.2 06/24/2015 1242   VLDL 12 06/24/2015 1242   LDLCALC 61 03/25/2020 0901    Physical Exam:    VS:  BP (!) 132/92   Pulse 66   Ht 6' (1.829 m)   Wt 151 lb 6.4 oz (68.7 kg)   SpO2 97%   BMI 20.53 kg/m     Wt Readings from Last 3 Encounters:  08/11/21 151 lb 6.4 oz (68.7 kg)  01/15/21 154 lb (69.9 kg)  01/14/21 154 lb 8 oz (70.1 kg)     GEN: Healthy appearing. No acute distress HEENT: Normal NECK: No JVD. LYMPHATICS: No lymphadenopathy CARDIAC: No murmur. RRR no gallop, or edema. VASCULAR:  Normal Pulses. No bruits. RESPIRATORY:  Clear to auscultation without rales, wheezing or rhonchi  ABDOMEN: Soft, non-tender, non-distended, No pulsatile mass, MUSCULOSKELETAL: No deformity  SKIN: Warm and dry NEUROLOGIC:  Alert and oriented x 3 PSYCHIATRIC:  Normal affect   ASSESSMENT:    1. Coronary artery disease involving native coronary artery of native heart without angina pectoris   2. Essential hypertension   3. Chronic diastolic heart failure (HCC)   4. Other hyperlipidemia    PLAN:    In order of problems listed above:  Liver and lipid panel.  Aerobic activity.  Report angina. Low-salt diet.  Monitor blood pressures.  Target 130/80 mmHg. No evidence of volume overload. Liver and lipid panel today  Overall education and awareness concerning primary/secondary risk prevention was discussed in detail: LDL less than 70, hemoglobin A1c less than 7, blood pressure target less than 130/80 mmHg, >150  minutes of moderate aerobic activity per week, avoidance of smoking, weight control (via diet and exercise), and continued surveillance/management of/for obstructive sleep apnea.    Medication Adjustments/Labs and Tests Ordered: Current medicines are reviewed at length with the patient today.  Concerns regarding medicines are outlined above.  Orders Placed This Encounter  Procedures   Lipid panel   Comprehensive metabolic panel   EKG [id number]   Meds ordered this encounter  Medications   lisinopril (ZESTRIL) 5 MG tablet    Sig: Take 1 tablet (5 mg total) by mouth daily.    Dispense:  90 tablet    Refill:  3   rosuvastatin (CRESTOR) 40 MG tablet    Sig: Take 1 tablet (40 mg total) by  mouth daily.    Dispense:  90 tablet    Refill:  3    Patient Instructions  Medication Instructions:  Your physician recommends that you continue on your current medications as directed. Please refer to the Current Medication list given to you today.  *If you need a refill on your cardiac medications before your next appointment, please call your pharmacy*  Lab Work: TODAY: Lipid panel, CMET If you have labs (blood work) drawn today and your tests are completely normal, you will receive your results only by: [place] (if you have MyChart) OR A paper copy in the mail If you have any lab test that is abnormal or we need to change your treatment, we will call you to review the results.  Testing/Procedures: NONE  Follow-Up: At [facility], you and your health needs are our priority.  As part of our continuing mission to provide you with exceptional heart care, we have created designated Provider Care Teams.  These Care Teams include your primary Cardiologist (physician) and Advanced Practice Providers (APPs -  Physician Assistants and Nurse Practitioners) who all work together to provide you with the care you need, when you need it.  Your next appointment:   1 year(s)  The format for your  next appointment:   In Person  Provider:   Sinclair Grooms, MD {   Important Information About Sugar         Signed, Sinclair Grooms, MD  08/11/2021 1:53 PM    [place]

## 2021-08-11 ENCOUNTER — Encounter: Payer: Self-pay | Admitting: Interventional Cardiology

## 2021-08-11 ENCOUNTER — Ambulatory Visit (INDEPENDENT_AMBULATORY_CARE_PROVIDER_SITE_OTHER): Payer: 59 | Admitting: Interventional Cardiology

## 2021-08-11 VITALS — BP 132/92 | HR 66 | Ht 72.0 in | Wt 151.4 lb

## 2021-08-11 DIAGNOSIS — E7849 Other hyperlipidemia: Secondary | ICD-10-CM | POA: Diagnosis not present

## 2021-08-11 DIAGNOSIS — I5032 Chronic diastolic (congestive) heart failure: Secondary | ICD-10-CM

## 2021-08-11 DIAGNOSIS — I10 Essential (primary) hypertension: Secondary | ICD-10-CM | POA: Diagnosis not present

## 2021-08-11 DIAGNOSIS — I2510 Atherosclerotic heart disease of native coronary artery without angina pectoris: Secondary | ICD-10-CM | POA: Diagnosis not present

## 2021-08-11 MED ORDER — ROSUVASTATIN CALCIUM 40 MG PO TABS: 40.0000 mg | ORAL_TABLET | Freq: Every day | ORAL | 3 refills | Status: DC

## 2021-08-11 MED ORDER — LISINOPRIL 5 MG PO TABS: 5.0000 mg | ORAL_TABLET | Freq: Every day | ORAL | 3 refills | Status: DC

## 2021-08-11 NOTE — Patient Instructions (Signed)
Medication Instructions:  Your physician recommends that you continue on your current medications as directed. Please refer to the Current Medication list given to you today.  *If you need a refill on your cardiac medications before your next appointment, please call your pharmacy*  Lab Work: TODAY: Lipid panel, CMET If you have labs (blood work) drawn today and your tests are completely normal, you will receive your results only by: [place] (if you have MyChart) OR A paper copy in the mail If you have any lab test that is abnormal or we need to change your treatment, we will call you to review the results.  Testing/Procedures: NONE  Follow-Up: At [facility], you and your health needs are our priority.  As part of our continuing mission to provide you with exceptional heart care, we have created designated Provider Care Teams.  These Care Teams include your primary Cardiologist (physician) and Advanced Practice Providers (APPs -  Physician Assistants and Nurse Practitioners) who all work together to provide you with the care you need, when you need it.  Your next appointment:   1 year(s)  The format for your next appointment:   In Person  Provider:   Sinclair Grooms, MD {   Important Information About Sugar

## 2021-08-12 LAB — COMPREHENSIVE METABOLIC PANEL
ALT: 16 IU/L (ref 0–44)
AST: 19 IU/L (ref 0–40)
Albumin/Globulin Ratio: 1.8 (ref 1.2–2.2)
Albumin: 4.8 g/dL (ref 3.8–4.9)
Alkaline Phosphatase: 75 IU/L (ref 44–121)
BUN/Creatinine Ratio: 16 (ref 9–20)
BUN: 16 mg/dL (ref 6–24)
Bilirubin Total: 0.4 mg/dL (ref 0.0–1.2)
CO2: 23 mmol/L (ref 20–29)
Calcium: 10.1 mg/dL (ref 8.7–10.2)
Chloride: 104 mmol/L (ref 96–106)
Creatinine, Ser: 1 mg/dL (ref 0.76–1.27)
Globulin, Total: 2.6 g/dL (ref 1.5–4.5)
Glucose: 100 mg/dL — ABNORMAL HIGH (ref 70–99)
Potassium: 4.5 mmol/L (ref 3.5–5.2)
Sodium: 141 mmol/L (ref 134–144)
Total Protein: 7.4 g/dL (ref 6.0–8.5)
eGFR: 90 mL/min/{1.73_m2} (ref >59)

## 2021-08-12 LAB — LIPID PANEL
Chol/HDL Ratio: 3.3 ratio (ref 0.0–5.0)
Cholesterol, Total: 152 mg/dL (ref 100–199)
HDL: 46 mg/dL (ref >39)
LDL Chol Calc (NIH): 84 mg/dL (ref 0–99)
Triglycerides: 126 mg/dL (ref 0–149)
VLDL Cholesterol Cal: 22 mg/dL (ref 5–40)

## 2022-08-19 ENCOUNTER — Other Ambulatory Visit: Payer: Self-pay

## 2022-09-19 DIAGNOSIS — R21 Rash and other nonspecific skin eruption: Secondary | ICD-10-CM | POA: Diagnosis not present

## 2022-09-19 DIAGNOSIS — B351 Tinea unguium: Secondary | ICD-10-CM | POA: Diagnosis not present

## 2022-09-19 DIAGNOSIS — Z026 Encounter for examination for insurance purposes: Secondary | ICD-10-CM | POA: Diagnosis not present

## 2022-09-19 DIAGNOSIS — Z0000 Encounter for general adult medical examination without abnormal findings: Secondary | ICD-10-CM | POA: Diagnosis not present

## 2022-09-22 ENCOUNTER — Other Ambulatory Visit: Payer: Self-pay

## 2022-09-22 MED ORDER — LISINOPRIL 5 MG PO TABS: 5.0000 mg | ORAL_TABLET | Freq: Every day | ORAL | 0 refills | Status: DC

## 2022-10-21 ENCOUNTER — Other Ambulatory Visit: Payer: Self-pay

## 2022-10-21 MED ORDER — LISINOPRIL 5 MG PO TABS: 5.0000 mg | ORAL_TABLET | Freq: Every day | ORAL | 0 refills | Status: DC

## 2022-10-27 ENCOUNTER — Telehealth: Payer: Self-pay | Admitting: Interventional Cardiology

## 2022-10-27 MED ORDER — LISINOPRIL 5 MG PO TABS: 5.0000 mg | ORAL_TABLET | Freq: Every day | ORAL | 0 refills | Status: DC

## 2022-10-27 NOTE — Telephone Encounter (Signed)
Pt's medication was sent to pt's pharmacy as requested. Confirmation received.  °

## 2022-10-27 NOTE — Telephone Encounter (Signed)
*  STAT* If patient is at the pharmacy, call can be transferred to refill team.   1. Which medications need to be refilled? (please list name of each medication and dose if known) lisinopril (ZESTRIL) 5 MG tablet   2. Which pharmacy/location (including street and city if local pharmacy) is medication to be sent to?  [facility] [address], [place] - [address]    3. Do they need a 30 day or 90 day supply? 90  Patient is requesting 90 day supply due to cost savings.

## 2022-10-30 DIAGNOSIS — J018 Other acute sinusitis: Secondary | ICD-10-CM | POA: Diagnosis not present

## 2022-10-30 DIAGNOSIS — R0981 Nasal congestion: Secondary | ICD-10-CM | POA: Diagnosis not present

## 2022-10-30 DIAGNOSIS — B351 Tinea unguium: Secondary | ICD-10-CM | POA: Diagnosis not present

## 2022-12-19 DIAGNOSIS — B351 Tinea unguium: Secondary | ICD-10-CM | POA: Diagnosis not present

## 2022-12-28 NOTE — Progress Notes (Signed)
  Cardiology Office Note:  .   Date:  01/05/2023  ID:  Jeremy Vazquez, DOB 11/24/1970, MRN [id number] PCP: Patient, No Pcp Per  [facility] Providers Cardiologist:  Donato Schultz, MD    History of Present Illness: .   Jeremy Vazquez is a 52 y.o. male   a hx of CAD LAD STEMI 2016 (DES mid vessel), transient acute systolic heart failure resolved to diastolic heart failure with EF greater than 50%, essential hypertension, and hyperlipidemia.   Patient comes in for f/u. Denies chest pain, dyspnea, palpitations, edema. He works doing Theatre stage manager. He walks 35-40 min daily. He runs 40 min 5 days a week in the summer. BP up today, usually 140's/80's. His wife cooks with a lot of salt. Hasn't taken meds yet. He had labs at work and urgent care. He says TC 180, HDL 41, LDL 69.      ROS:    Studies Reviewed: Marland Kitchen    EKG Interpretation Date/Time:  Tuesday January 05 2023 07:49:07 EST Ventricular Rate:  59 PR Interval:  158 QRS Duration:  108 QT Interval:  386 QTC Calculation: 382 R Axis:   41  Text Interpretation: Sinus bradycardia Minimal voltage criteria for LVH, may be normal variant ( Cornell product ) When compared with ECG of 18-Nov-2014 19:20, T wave inversion no longer evident in Anterior leads Confirmed by Jacolyn Reedy [phone]) on 01/05/2023 7:54:46 AM    Prior CV Studies:      Risk Assessment/Calculations:     HYPERTENSION CONTROL Vitals:   01/05/23 0744 01/05/23 0811  BP: (!) 144/90 (!) 142/98    The patient's blood pressure is elevated above target today.  In order to address the patient's elevated BP: Blood pressure will be monitored at home to determine if medication changes need to be made.; A current anti-hypertensive medication was adjusted today.; A return visit for a blood pressure check with the RN or CMA will be arranged.          Physical Exam:   VS:  BP (!) 142/98   Pulse 81   Ht 6' (1.829 m)   Wt 161 lb 12.8 oz (73.4 kg)   SpO2 96%   BMI  21.94 kg/m    Wt Readings from Last 3 Encounters:  01/05/23 161 lb 12.8 oz (73.4 kg)  08/11/21 151 lb 6.4 oz (68.7 kg)  01/15/21 154 lb (69.9 kg)    GEN: Thin, in no acute distress NECK: No JVD; No carotid bruits CARDIAC:  RRR, no murmurs, rubs, gallops RESPIRATORY:  Clear to auscultation without rales, wheezing or rhonchi  ABDOMEN: Soft, non-tender, non-distended EXTREMITIES:  No edema; No deformity   ASSESSMENT AND PLAN: .    CAD STEMI 2016 DES LAD-no angina, continue ASA & Crestor.   ICM EF 35% improved to 55%-euvolemic  HTN-BP running high. Gets a lot of salt in his diet. Increase lisinorpil to 10 mg, 2 gm sodium diet, repeat bmet in 1-2 weeks with nurse visit to recheck BP.   Chronic diastolic CHF-compensated.  HLD-he says LDL 69 when checked at work. To bring copy of labs with him.        Dispo: f/u in 1 yr Dr. Anne Fu  Signed, Jacolyn Reedy, PA-C

## 2023-01-05 ENCOUNTER — Other Ambulatory Visit: Payer: Self-pay | Admitting: *Deleted

## 2023-01-05 ENCOUNTER — Ambulatory Visit: Payer: BC Managed Care – PPO | Attending: Physician Assistant | Admitting: Physician Assistant

## 2023-01-05 ENCOUNTER — Encounter: Payer: Self-pay | Admitting: Physician Assistant

## 2023-01-05 VITALS — BP 142/98 | HR 81 | Ht 72.0 in | Wt 161.8 lb

## 2023-01-05 DIAGNOSIS — I5032 Chronic diastolic (congestive) heart failure: Secondary | ICD-10-CM

## 2023-01-05 DIAGNOSIS — I255 Ischemic cardiomyopathy: Secondary | ICD-10-CM

## 2023-01-05 DIAGNOSIS — I10 Essential (primary) hypertension: Secondary | ICD-10-CM | POA: Diagnosis not present

## 2023-01-05 DIAGNOSIS — I2510 Atherosclerotic heart disease of native coronary artery without angina pectoris: Secondary | ICD-10-CM

## 2023-01-05 DIAGNOSIS — E7849 Other hyperlipidemia: Secondary | ICD-10-CM

## 2023-01-05 MED ORDER — LISINOPRIL 10 MG PO TABS: 10.0000 mg | ORAL_TABLET | Freq: Every day | ORAL | 3 refills | Status: DC

## 2023-01-05 NOTE — Patient Instructions (Addendum)
Medication Instructions:  Your physician has recommended you make the following change in your medication:   INCREASE Lisinopril to 10 mg taking 1 daily  *If you need a refill on your cardiac medications before your next appointment, please call your pharmacy*   Lab Work: BMET WHEN YOU COME BACK   If you have labs (blood work) drawn today and your tests are completely normal, you will receive your results only by: MyChart Message (if you have MyChart) OR A paper copy in the mail If you have any lab test that is abnormal or we need to change your treatment, we will call you to review the results.   Testing/Procedures: None ordered   Follow-Up: At [facility], you and your health needs are our priority.  As part of our continuing mission to provide you with exceptional heart care, we have created designated Provider Care Teams.  These Care Teams include your primary Cardiologist (physician) and Advanced Practice Providers (APPs -  Physician Assistants and Nurse Practitioners) who all work together to provide you with the care you need, when you need it.  We recommend signing up for the patient portal called "MyChart".  Sign up information is provided on this After Visit Summary.  MyChart is used to connect with patients for Virtual Visits (Telemedicine).  Patients are able to view lab/test results, encounter notes, upcoming appointments, etc.  Non-urgent messages can be sent to your provider as well.   To learn more about what you can do with MyChart, go to [email or web address].    Your next appointment:   2 WEEKS FOR A NURSE VISIT FOR BLOOD PRESSURE CHECK AND BMET  12 month(s)  Provider:   Donato Schultz, MD     Other Instructions  Your physician has requested that you regularly monitor and record your blood pressure readings at home. Please use the same machine at the same time of day to check your readings and record them to bring to your follow-up visit.   Please  monitor blood pressures and keep a log of your readings.    Make sure to check 2 hours after your medications for 2 weeks then call the readings into [place].    AVOID these things for 30 minutes before checking your blood pressure: No Drinking caffeine. No Drinking alcohol. No Eating. No Smoking. No Exercising.   Five minutes before checking your blood pressure: Pee. Sit in a dining chair. Avoid sitting in a soft couch or armchair. Be quiet. Do not talk  Low-Sodium Eating Plan Salt (sodium) helps you keep a healthy balance of fluids in your body. Too much sodium can raise your blood pressure. It can also cause fluid and waste to be held in your body. Your health care provider or dietitian may recommend a low-sodium eating plan if you have high blood pressure (hypertension), kidney disease, liver disease, or heart failure. Eating less sodium can help lower your blood pressure and reduce swelling. It can also protect your heart, liver, and kidneys. What are tips for following this plan? Reading food labels  Check food labels for the amount of sodium per serving. If you eat more than one serving, you must multiply the listed amount by the number of servings. Choose foods with less than 140 milligrams (mg) of sodium per serving. Avoid foods with 300 mg of sodium or more per serving. Always check how much sodium is in a product, even if the label says "unsalted" or "no salt added." Shopping  Buy products labeled  as "low-sodium" or "no salt added." Buy fresh foods. Avoid canned foods and pre-made or frozen meals. Avoid canned, cured, or processed meats. Buy breads that have less than 80 mg of sodium per slice. Cooking  Eat more home-cooked food. Try to eat less restaurant, buffet, and fast food. Try not to add salt when you cook. Use salt-free seasonings or herbs instead of table salt or sea salt. Check with your provider or pharmacist before using salt substitutes. Cook with plant-based  oils, such as canola, sunflower, or olive oil. Meal planning When eating at a restaurant, ask if your food can be made with less salt or no salt. Avoid dishes labeled as brined, pickled, cured, or smoked. Avoid dishes made with soy sauce, miso, or teriyaki sauce. Avoid foods that have monosodium glutamate (MSG) in them. MSG may be added to some restaurant food, sauces, soups, bouillon, and canned foods. Make meals that can be grilled, baked, poached, roasted, or steamed. These are often made with less sodium. General information Try to limit your sodium intake to 1,500-2,300 mg each day, or the amount told by your provider. What foods should I eat? Fruits Fresh, frozen, or canned fruit. Fruit juice. Vegetables Fresh or frozen vegetables. "No salt added" canned vegetables. "No salt added" tomato sauce and paste. Low-sodium or reduced-sodium tomato and vegetable juice. Grains Low-sodium cereals, such as oats, puffed wheat and rice, and shredded wheat. Low-sodium crackers. Unsalted rice. Unsalted pasta. Low-sodium bread. Whole grain breads and whole grain pasta. Meats and other proteins Fresh or frozen meat, poultry, seafood, and fish. These should have no added salt. Low-sodium canned tuna and salmon. Unsalted nuts. Dried peas, beans, and lentils without added salt. Unsalted canned beans. Eggs. Unsalted nut butters. Dairy Milk. Soy milk. Cheese that is naturally low in sodium, such as ricotta cheese, fresh mozzarella, or Swiss cheese. Low-sodium or reduced-sodium cheese. Cream cheese. Yogurt. Seasonings and condiments Fresh and dried herbs and spices. Salt-free seasonings. Low-sodium mustard and ketchup. Sodium-free salad dressing. Sodium-free light mayonnaise. Fresh or refrigerated horseradish. Lemon juice. Vinegar. Other foods Homemade, reduced-sodium, or low-sodium soups. Unsalted popcorn and pretzels. Low-salt or salt-free chips. The items listed above may not be all the foods and drinks you  can have. Talk to a dietitian to learn more. What foods should I avoid? Vegetables Sauerkraut, pickled vegetables, and relishes. Olives. [place] fries. Onion rings. Regular canned vegetables, except low-sodium or reduced-sodium items. Regular canned tomato sauce and paste. Regular tomato and vegetable juice. Frozen vegetables in sauces. Grains Instant hot cereals. Bread stuffing, pancake, and biscuit mixes. Croutons. Seasoned rice or pasta mixes. Noodle soup cups. Boxed or frozen macaroni and cheese. Regular salted crackers. Self-rising flour. Meats and other proteins Meat or fish that is salted, canned, smoked, spiced, or pickled. Precooked or cured meat, such as sausages or meat loaves. Tomasa Blase. Ham. Pepperoni. Hot dogs. Corned beef. Chipped beef. Salt pork. Jerky. Pickled herring, anchovies, and sardines. Regular canned tuna. Salted nuts. Dairy Processed cheese and cheese spreads. Hard cheeses. Cheese curds. Blue cheese. Feta cheese. String cheese. Regular cottage cheese. Buttermilk. Canned milk. Fats and oils Salted butter. Regular margarine. Ghee. Bacon fat. Seasonings and condiments Onion salt, garlic salt, seasoned salt, table salt, and sea salt. Canned and packaged gravies. Worcestershire sauce. Tartar sauce. Barbecue sauce. Teriyaki sauce. Soy sauce, including reduced-sodium soy sauce. Steak sauce. Fish sauce. Oyster sauce. Cocktail sauce. Horseradish that you find on the shelf. Regular ketchup and mustard. Meat flavorings and tenderizers. Bouillon cubes. Hot sauce. Pre-made or packaged  marinades. Pre-made or packaged taco seasonings. Relishes. Regular salad dressings. Salsa. Other foods Salted popcorn and pretzels. Corn chips and puffs. Potato and tortilla chips. Canned or dried soups. Pizza. Frozen entrees and pot pies. The items listed above may not be all the foods and drinks you should avoid. Talk to a dietitian to learn more. This information is not intended to replace advice given to  you by your health care provider. Make sure you discuss any questions you have with your health care provider. Document Revised: 01/22/2022 Document Reviewed: 01/22/2022 Elsevier Patient Education  2024 [employer].

## 2023-01-19 ENCOUNTER — Ambulatory Visit: Payer: BC Managed Care – PPO | Attending: Cardiology | Admitting: Cardiology

## 2023-01-19 VITALS — BP 140/90 | HR 62 | Ht 72.0 in | Wt 161.0 lb

## 2023-01-19 DIAGNOSIS — I10 Essential (primary) hypertension: Secondary | ICD-10-CM | POA: Diagnosis not present

## 2023-01-19 DIAGNOSIS — I5032 Chronic diastolic (congestive) heart failure: Secondary | ICD-10-CM | POA: Diagnosis not present

## 2023-01-19 DIAGNOSIS — I255 Ischemic cardiomyopathy: Secondary | ICD-10-CM | POA: Diagnosis not present

## 2023-01-19 DIAGNOSIS — I2510 Atherosclerotic heart disease of native coronary artery without angina pectoris: Secondary | ICD-10-CM | POA: Diagnosis not present

## 2023-01-19 NOTE — Progress Notes (Signed)
   Nurse Visit   Date of Encounter: 01/19/2023 ID: Jeremy Vazquez, DOB 04-12-1970, MRN [id number]  PCP:  Patient, No Pcp Per   [facility] Providers Cardiologist:  Oneil Parchment, MD      Visit Details   VS:  BP (!) 140/90   Pulse 62   Ht 6' (1.829 m)   Wt 161 lb (73 kg)   BMI 21.84 kg/m  , BMI Body mass index is 21.84 kg/m.  Wt Readings from Last 3 Encounters:  01/19/23 161 lb (73 kg)  01/05/23 161 lb 12.8 oz (73.4 kg)  08/11/21 151 lb 6.4 oz (68.7 kg)     Reason for visit: B/P CHECK Performed today: Vitals Changes (medications, testing, etc.) : NO CHANGES  Length of Visit: 5 minutes  PER PT TAKES LISINOPRIL  10 EVERY DAY BUT VARIES TIME OF DAY MAY TAKE 9 AM THEN NEXT DAY NOT TAKE TIL 12  INSTRUCTED  PT TO CONTINUE TO MONITOR B/P AND CALL IF DOES NOT SEE ANY IMPROVEMENT   Medications Adjustments/Labs and Tests Ordered: No orders of the defined types were placed in this encounter.  No orders of the defined types were placed in this encounter.    Bonney Wanda Parkin, LPN  [id number] 3:30 PM

## 2023-01-20 LAB — BASIC METABOLIC PANEL
BUN/Creatinine Ratio: 17 (ref 9–20)
BUN: 18 mg/dL (ref 6–24)
CO2: 25 mmol/L (ref 20–29)
Calcium: 10.5 mg/dL — ABNORMAL HIGH (ref 8.7–10.2)
Chloride: 100 mmol/L (ref 96–106)
Creatinine, Ser: 1.04 mg/dL (ref 0.76–1.27)
Glucose: 92 mg/dL (ref 70–99)
Potassium: 4.8 mmol/L (ref 3.5–5.2)
Sodium: 138 mmol/L (ref 134–144)
eGFR: 85 mL/min/{1.73_m2} (ref >59)

## 2023-05-25 ENCOUNTER — Other Ambulatory Visit: Payer: Self-pay

## 2023-05-25 MED ORDER — ROSUVASTATIN CALCIUM 40 MG PO TABS: 40.0000 mg | ORAL_TABLET | Freq: Every day | ORAL | 2 refills | Status: AC

## 2024-01-10 ENCOUNTER — Other Ambulatory Visit: Payer: Self-pay | Admitting: Physician Assistant
